# Patient Record
Sex: Female | Born: 1995
Health system: Southern US, Community
[De-identification: ages and names within clinical notes are randomized; demographics above are authoritative.]

## PROBLEM LIST (undated history)

## (undated) DIAGNOSIS — F909 Attention-deficit hyperactivity disorder, unspecified type: Secondary | ICD-10-CM

## (undated) HISTORY — DX: Attention-deficit hyperactivity disorder, unspecified type: F90.9

---
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
## 2013-10-08 ENCOUNTER — Other Ambulatory Visit: Payer: Self-pay | Admitting: Certified Nurse Midwife

## 2013-10-08 NOTE — Telephone Encounter (Signed)
Last AEX 11/25/12 No Current Aex scheduled #28/1 refill sent through to last patient

## 2013-12-16 ENCOUNTER — Encounter: Payer: Self-pay | Admitting: Certified Nurse Midwife

## 2013-12-17 ENCOUNTER — Ambulatory Visit (INDEPENDENT_AMBULATORY_CARE_PROVIDER_SITE_OTHER): Payer: BC Managed Care – PPO | Admitting: Certified Nurse Midwife

## 2013-12-17 ENCOUNTER — Encounter: Payer: Self-pay | Admitting: Certified Nurse Midwife

## 2013-12-17 VITALS — BP 122/81 | HR 75 | Resp 16 | Ht 66.5 in | Wt 150.0 lb

## 2013-12-17 DIAGNOSIS — Z309 Encounter for contraceptive management, unspecified: Secondary | ICD-10-CM

## 2013-12-17 DIAGNOSIS — Z01419 Encounter for gynecological examination (general) (routine) without abnormal findings: Secondary | ICD-10-CM

## 2013-12-17 DIAGNOSIS — Z0000 Encounter for general adult medical examination without abnormal findings: Secondary | ICD-10-CM

## 2013-12-17 LAB — POCT URINALYSIS DIPSTICK
Bilirubin, UA: NEGATIVE
Blood, UA: NEGATIVE
Glucose, UA: NEGATIVE
Ketones, UA: NEGATIVE
Leukocytes, UA: NEGATIVE
Nitrite, UA: NEGATIVE
Protein, UA: NEGATIVE
Urobilinogen, UA: NEGATIVE
pH, UA: 5

## 2013-12-17 MED ORDER — LEVONORGEST-ETH ESTRAD 91-DAY 0.15-0.03 MG PO TABS: 1.0000 | ORAL_TABLET | Freq: Every day | ORAL | Status: DC

## 2013-12-17 NOTE — Progress Notes (Signed)
18 y.o. G0P0000 Single Caucasian Fe here for annual exam. Periods normal on placebo week of Seasonique, but has brown discharge for 2 weeks prior, since onset of use. Desires extended cycle OCP continues if possible. No STD concerns no partner change, first for both.  Sees PCP prn. No other health issues today. Happy at college! Patient's last menstrual period was 12/03/2013.          Sexually active: yes  The current method of family planning is OCP (estrogen/progesterone).    Exercising: yes  walk, eliptical Smoker:  no  Health Maintenance: Pap:  none MMG:  none Colonoscopy:  none BMD:   none TDaP: 2011 Labs: Poct urine-neg Self breast exam: not done    reports that she has never smoked. She does not have any smokeless tobacco history on file. She reports that she does not drink alcohol or use illicit drugs.  Past Medical History  Diagnosis Date  . ADHD (attention deficit hyperactivity disorder)     Past Surgical History  Procedure Laterality Date  . Wisdom tooth extraction  2009    Current Outpatient Prescriptions  Medication Sig Dispense Refill  . dexmethylphenidate (FOCALIN) 5 MG tablet Take 5 mg by mouth as needed.      Marland Kitchen levocetirizine (XYZAL) 5 MG tablet Take 5 mg by mouth every evening.      . lisdexamfetamine (VYVANSE) 50 MG capsule Take 50 mg by mouth daily.      . montelukast (SINGULAIR) 10 MG tablet Take 10 mg by mouth at bedtime.      Marland Kitchen levonorgestrel-ethinyl estradiol (SEASONALE,INTROVALE,JOLESSA) 0.15-0.03 MG tablet Take 1 tablet by mouth daily.  1 Package  4   No current facility-administered medications for this visit.    Family History  Problem Relation Age of Onset  . Diabetes Mother     ROS:  Pertinent items are noted in HPI.  Otherwise, a comprehensive ROS was negative.  Exam:   BP 122/81  Pulse 75  Resp 16  Ht 5' 6.5" (1.689 m)  Wt 150 lb (68.04 kg)  BMI 23.85 kg/m2  LMP 12/03/2013 Height: 5' 6.5" (168.9 cm)  Ht Readings from Last 3  Encounters:  12/17/13 5' 6.5" (1.689 m) (81%*, Z = 0.87)   * Growth percentiles are based on CDC 2-20 Years data.    General appearance: alert, cooperative and appears stated age Head: Normocephalic, without obvious abnormality, atraumatic Neck: no adenopathy, supple, symmetrical, trachea midline and thyroid normal to inspection and palpation and non-palpable Lungs: clear to auscultation bilaterally Breasts: normal appearance, no masses or tenderness, No nipple retraction or dimpling, No nipple discharge or bleeding, No axillary or supraclavicular adenopathy Heart: regular rate and rhythm Abdomen: soft, non-tender; no masses,  no organomegaly Extremities: extremities normal, atraumatic, no cyanosis or edema Skin: Skin color, texture, turgor normal. No rashes or lesions Lymph nodes: Cervical, supraclavicular, and axillary nodes normal. No abnormal inguinal nodes palpated Neurologic: Grossly normal   Pelvic: External genitalia:  no lesions              Urethra:  normal appearing urethra with no masses, tenderness or lesions              Bartholin's and Skene's: normal                 Vagina: normal appearing vagina with normal color and discharge, no lesions              Cervix: normal, non tender  Pap taken: no Bimanual Exam:  Uterus:  normal size, contour, position, consistency, mobility, non-tender and anteverted              Adnexa: normal adnexa and no mass, fullness, tenderness               Rectovaginal: Confirms               Anus:  deferred  A:  Well Woman with normal exam  Contraception OCP desired  P:   Reviewed health and wellness pertinent to exam   Rx Seasonale with instructions, [facility], may stop the brown spotting. Patient to keep menses calendar and advise.  Pap smear as per guidelines   pap smear not taken today age 18  counseled on breast self exam, STD prevention, HIV risk factors and prevention, use and side  effects of OCP's, adequate intake of calcium and vitamin D  return annually or prn  An After Visit Summary was printed and given to the patient.

## 2013-12-17 NOTE — Patient Instructions (Signed)
General topics  Next pap or exam is  due in 1 year Take a Women's multivitamin Take 1200 mg. of calcium daily - prefer dietary If any concerns in interim to call back  Breast Self-Awareness Practicing breast self-awareness may pick up problems early, prevent significant medical complications, and possibly save your life. By practicing breast self-awareness, you can become familiar with how your breasts look and feel and if your breasts are changing. This allows you to notice changes early. It can also offer you some reassurance that your breast health is good. One way to learn what is normal for your breasts and whether your breasts are changing is to do a breast self-exam. If you find a lump or something that was not present in the past, it is best to contact your caregiver right away. Other findings that should be evaluated by your caregiver include nipple discharge, especially if it is bloody; skin changes or reddening; areas where the skin seems to be pulled in (retracted); or new lumps and bumps. Breast pain is seldom associated with cancer (malignancy), but should also be evaluated by a caregiver. BREAST SELF-EXAM The best time to examine your breasts is 5 7 days after your menstrual period is over.  ExitCare Patient Information 2013 [employer].   Exercise to Stay Healthy Exercise helps you become and stay healthy. EXERCISE IDEAS AND TIPS Choose exercises that:  You enjoy.  Fit into your day. You do not need to exercise really hard to be healthy. You can do exercises at a slow or medium level and stay healthy. You can:  Stretch before and after working out.  Try yoga, Pilates, or tai chi.  Lift weights.  Walk fast, swim, jog, run, climb stairs, bicycle, dance, or rollerskate.  Take aerobic classes. Exercises that burn about 150 calories:  Running 1  miles in 15 minutes.  Playing volleyball for 45 to 60 minutes.  Washing and waxing a car for 45 to 60  minutes.  Playing touch football for 45 minutes.  Walking 1  miles in 35 minutes.  Pushing a stroller 1  miles in 30 minutes.  Playing basketball for 30 minutes.  Raking leaves for 30 minutes.  Bicycling 5 miles in 30 minutes.  Walking 2 miles in 30 minutes.  Dancing for 30 minutes.  Shoveling snow for 15 minutes.  Swimming laps for 20 minutes.  Walking up stairs for 15 minutes.  Bicycling 4 miles in 15 minutes.  Gardening for 30 to 45 minutes.  Jumping rope for 15 minutes.  Washing windows or floors for 45 to 60 minutes. Document Released: 10/26/2010 Document Revised: 12/16/2011 Document Reviewed: 10/26/2010 ExitCare Patient Information 2013 [employer].   Other topics ( that may be useful information):    Sexually Transmitted Disease Sexually transmitted disease (STD) refers to any infection that is passed from person to person during sexual activity. This may happen by way of saliva, semen, blood, vaginal mucus, or urine. Common STDs include:  Gonorrhea.  Chlamydia.  Syphilis.  HIV/AIDS.  Genital herpes.  Hepatitis B and C.  Trichomonas.  Human papillomavirus (HPV).  Pubic lice. CAUSES  An STD may be spread by bacteria, virus, or parasite. A person can get an STD by:  Sexual intercourse with an infected person.  Sharing sex toys with an infected person.  Sharing needles with an infected person.  Having intimate contact with the genitals, mouth, or rectal areas of an infected person. SYMPTOMS  Some people may not have any symptoms, but   they can still pass the infection to others. Different STDs have different symptoms. Symptoms include:  Painful or bloody urination.  Pain in the pelvis, abdomen, vagina, anus, throat, or eyes.  Skin rash, itching, irritation, growths, or sores (lesions). These usually occur in the genital or anal area.  Abnormal vaginal discharge.  Penile discharge in men.  Soft, flesh-colored skin growths in the  genital or anal area.  Fever.  Pain or bleeding during sexual intercourse.  Swollen glands in the groin area.  Yellow skin and eyes (jaundice). This is seen with hepatitis. DIAGNOSIS  To make a diagnosis, your caregiver may:  Take a medical history.  Perform a physical exam.  Take a specimen (culture) to be examined.  Examine a sample of discharge under a microscope.  Perform blood test TREATMENT   Chlamydia, gonorrhea, trichomonas, and syphilis can be cured with antibiotic medicine.  Genital herpes, hepatitis, and HIV can be treated, but not cured, with prescribed medicines. The medicines will lessen the symptoms.  Genital warts from HPV can be treated with medicine or by freezing, burning (electrocautery), or surgery. Warts may come back.  HPV is a virus and cannot be cured with medicine or surgery.However, abnormal areas may be followed very closely by your caregiver and may be removed from the cervix, vagina, or vulva through office procedures or surgery. If your diagnosis is confirmed, your recent sexual partners need treatment. This is true even if they are symptom-free or have a negative culture or evaluation. They should not have sex until their caregiver says it is okay. HOME CARE INSTRUCTIONS  All sexual partners should be informed, tested, and treated for all STDs.  Take your antibiotics as directed. Finish them even if you start to feel better.  Only take over-the-counter or prescription medicines for pain, discomfort, or fever as directed by your caregiver.  Rest.  Eat a balanced diet and drink enough fluids to keep your urine clear or pale yellow.  Do not have sex until treatment is completed and you have followed up with your caregiver. STDs should be checked after treatment.  Keep all follow-up appointments, Pap tests, and blood tests as directed by your caregiver.  Only use latex condoms and water-soluble lubricants during sexual activity. Do not use  petroleum jelly or oils.  Avoid alcohol and illegal drugs.  Get vaccinated for HPV and hepatitis. If you have not received these vaccines in the past, talk to your caregiver about whether one or both might be right for you.  Avoid risky sex practices that can break the skin. The only way to avoid getting an STD is to avoid all sexual activity.Latex condoms and dental dams (for oral sex) will help lessen the risk of getting an STD, but will not completely eliminate the risk. SEEK MEDICAL CARE IF:   You have a fever.  You have any new or worsening symptoms. Document Released: 12/14/2002 Document Revised: 12/16/2011 Document Reviewed: 12/21/2010 [facility] Patient Information 2013 [place].    Domestic Abuse You are being battered or abused if someone close to you hits, pushes, or physically hurts you in any way. You also are being abused if you are forced into activities. You are being sexually abused if you are forced to have sexual contact of any kind. You are being emotionally abused if you are made to feel worthless or if you are constantly threatened. It is important to remember that help is available. No one has the right to abuse you. PREVENTION OF FURTHER  ABUSE  Learn the warning signs of danger. This varies with situations but may include: the use of alcohol, threats, isolation from friends and family, or forced sexual contact. Leave if you feel that violence is going to occur.  If you are attacked or beaten, report it to the police so the abuse is documented. You do not have to press charges. The police can protect you while you or the attackers are leaving. Get the officer's name and badge number and a copy of the report.  Find someone you can trust and tell them what is happening to you: your caregiver, a nurse, clergy member, close friend or family member. Feeling ashamed is natural, but remember that you have done nothing wrong. No one deserves abuse. Document Released:  09/20/2000 Document Revised: 12/16/2011 Document Reviewed: 11/29/2010 ExitCare Patient Information 2013 ExitCare[employer].    How Much is Too Much Alcohol? Drinking too much alcohol can cause injury, accidents, and health problems. These types of problems can include:   Car crashes.  Falls.  Family fighting (domestic violence).  Drowning.  Fights.  Injuries.  Burns.  Damage to certain organs.  Having a baby with birth defects. ONE DRINK CAN BE TOO MUCH WHEN YOU ARE:  Working.  Pregnant or breastfeeding.  Taking medicines. Ask your doctor.  Driving or planning to drive. If you or someone you know has a drinking problem, get help from a doctor.  Document Released: 07/20/2009 Document Revised: 12/16/2011 Document Reviewed: 07/20/2009 ExitCare Patient Information 2013 ExitCare[employer].   Smoking Hazards Smoking cigarettes is extremely bad for your health. Tobacco smoke has over 200 known poisons in it. There are over 60 chemicals in tobacco smoke that cause cancer. Some of the chemicals found in cigarette smoke include:   Cyanide.  Benzene.  Formaldehyde.  Methanol (wood alcohol).  Acetylene (fuel used in welding torches).  Ammonia. Cigarette smoke also contains the poisonous gases nitrogen oxide and carbon monoxide.  Cigarette smokers have an increased risk of many serious medical problems and Smoking causes approximately:  90% of all lung cancer deaths in men.  80% of all lung cancer deaths in women.  90% of deaths from chronic obstructive lung disease. Compared with nonsmokers, smoking increases the risk of:  Coronary heart disease by 2 to 4 times.  Stroke by 2 to 4 times.  Men developing lung cancer by 23 times.  Women developing lung cancer by 13 times.  Dying from chronic obstructive lung diseases by 12 times.  . Smoking is the most preventable cause of death and disease in our society.  WHY IS SMOKING ADDICTIVE?  Nicotine is the chemical  agent in tobacco that is capable of causing addiction or dependence.  When you smoke and inhale, nicotine is absorbed rapidly into the bloodstream through your lungs. Nicotine absorbed through the lungs is capable of creating a powerful addiction. Both inhaled and non-inhaled nicotine may be addictive.  Addiction studies of cigarettes and spit tobacco show that addiction to nicotine occurs mainly during the teen years, when young people begin using tobacco products. WHAT ARE THE BENEFITS OF QUITTING?  There are many health benefits to quitting smoking.   Likelihood of developing cancer and heart disease decreases. Health improvements are seen almost immediately.  Blood pressure, pulse rate, and breathing patterns start returning to normal soon after quitting. QUITTING SMOKING   American Lung Association - 1-800-LUNGUSA  American Cancer Society - 1-800-ACS-2345 Document Released: 10/31/2004 Document Revised: 12/16/2011 Document Reviewed: 07/05/2009 ExitCare Patient Information 2013 ExitCare,   LLC.   Stress Management Stress is a state of physical or mental tension that often results from changes in your life or normal routine. Some common causes of stress are:  Death of a loved one.  Injuries or severe illnesses.  Getting fired or changing jobs.  Moving into a new home. Other causes may be:  Sexual problems.  Business or financial losses.  Taking on a large debt.  Regular conflict with someone at home or at work.  Constant tiredness from lack of sleep. It is not just bad things that are stressful. It may be stressful to:  Win the lottery.  Get married.  Buy a new car. The amount of stress that can be easily tolerated varies from person to person. Changes generally cause stress, regardless of the types of change. Too much stress can affect your health. It may lead to physical or emotional problems. Too little stress (boredom) may also become stressful. SUGGESTIONS TO  REDUCE STRESS:  Talk things over with your family and friends. It often is helpful to share your concerns and worries. If you feel your problem is serious, you may want to get help from a professional counselor.  Consider your problems one at a time instead of lumping them all together. Trying to take care of everything at once may seem impossible. List all the things you need to do and then start with the most important one. Set a goal to accomplish 2 or 3 things each day. If you expect to do too many in a single day you will naturally fail, causing you to feel even more stressed.  Do not use alcohol or drugs to relieve stress. Although you may feel better for a short time, they do not remove the problems that caused the stress. They can also be habit forming.  Exercise regularly - at least 3 times per week. Physical exercise can help to relieve that "uptight" feeling and will relax you.  The shortest distance between despair and hope is often a good night's sleep.  Go to bed and get up on time allowing yourself time for appointments without being rushed.  Take a short "time-out" period from any stressful situation that occurs during the day. Close your eyes and take some deep breaths. Starting with the muscles in your face, tense them, hold it for a few seconds, then relax. Repeat this with the muscles in your neck, shoulders, hand, stomach, back and legs.  Take good care of yourself. Eat a balanced diet and get plenty of rest.  Schedule time for having fun. Take a break from your daily routine to relax. HOME CARE INSTRUCTIONS   Call if you feel overwhelmed by your problems and feel you can no longer manage them on your own.  Return immediately if you feel like hurting yourself or someone else. Document Released: 03/19/2001 Document Revised: 12/16/2011 Document Reviewed: 11/09/2007 [facility] Patient Information 2013 [place].  Great to see you today. Let me know if brown discharge  continues. Have a great spring! Debbi

## 2013-12-24 NOTE — Progress Notes (Signed)
Reviewed personally.  M. Suzanne Sharan Mcenaney, MD.  

## 2014-07-11 ENCOUNTER — Telehealth: Payer: Self-pay | Admitting: Certified Nurse Midwife

## 2014-07-11 NOTE — Telephone Encounter (Signed)
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 Patient will be in town late this week. Please advise?

## 2014-07-11 NOTE — Telephone Encounter (Signed)
Spoke with patient's mother. Appointment scheduled for this Thursday at 10:45am with Dr.Miller. Agreeable to date and time.  Routing to Dr.Miller Cc: Verner Choleborah S. Leonard CNM   Routing to provider for final review. Patient agreeable to disposition. Will close encounter ;

## 2014-07-11 NOTE — Telephone Encounter (Signed)
Spoke with patient's mother. Okay per ROI. Who states that patient was seen at student health center for break through bleeding and was told that she has a polyp on her cervix that needs removal. Mother states that patient would feel more comfortable having it done at our office. Patient will be in town for fall break this Thursday and Friday and requests an appointment. Available early morning Thursday and then after 2pm or any time on Friday. Advised would have to check scheduling and give her a call back. Mother is agreeable.

## 2014-07-14 ENCOUNTER — Encounter: Payer: Self-pay | Admitting: Obstetrics & Gynecology

## 2014-07-14 ENCOUNTER — Ambulatory Visit (INDEPENDENT_AMBULATORY_CARE_PROVIDER_SITE_OTHER): Payer: BC Managed Care – PPO | Admitting: Obstetrics & Gynecology

## 2014-07-14 VITALS — BP 110/70 | HR 68 | Resp 16 | Ht 66.5 in | Wt 157.0 lb

## 2014-07-14 DIAGNOSIS — N930 Postcoital and contact bleeding: Secondary | ICD-10-CM

## 2014-07-14 DIAGNOSIS — Z124 Encounter for screening for malignant neoplasm of cervix: Secondary | ICD-10-CM

## 2014-07-14 NOTE — Progress Notes (Signed)
Subjective:     Patient ID: Brandi Bradshaw, female   DOB: [birth year], 18 y.o.   MRN: [id number]  HPI 18 yo G0 SWF here for polyp removal.  Was seen in student health due to two episodes of bleeding with intercourse.  Was advised she had a large cervical polyp that needed removal.  Bleeding has only been with intercourse, is painless and bright red.  It stops quickly after intercourse.  [facility].  No trouble with this.  Only cycles every 3 months.  LMP was in August.  Pt not quite sure of the date.  No urinary or bowel symptoms.    Review of Systems  All other systems reviewed and are negative.      Objective:   Physical Exam  Constitutional: She is oriented to person, place, and time. She appears well-developed and well-nourished.  Genitourinary: Vagina normal and uterus normal. There is no rash, tenderness or lesion on the right labia. There is no rash, tenderness or lesion on the left labia.    Lymphadenopathy:       Right: No inguinal adenopathy present.       Left: No inguinal adenopathy present.  Neurological: She is alert and oriented to person, place, and time.  Skin: Skin is warm and dry.  Psychiatric: She has a normal mood and affect.       Assessment:     Bleeding with intercourse x 2 Normal cervix with area of glandular tissue posterior due to ectocervical tradition zone     Plan:     Pap obtained.  D/W pt and mother findings.  Do not feel anything needs to be done at this time.  Pictures drawn for both pt and her mother for better understanding of findings.  As long as pap is normal, routine follow up recommended.  All questions answered.

## 2014-07-18 LAB — IPS PAP SMEAR ONLY

## 2014-08-25 ENCOUNTER — Telehealth: Payer: Self-pay | Admitting: Certified Nurse Midwife

## 2014-08-25 NOTE — Telephone Encounter (Signed)
Spoke with patient. Patient states that she is currently taking Seasonale for birth control. Has been taking this birth control since March on last year. "Towards the end of the second month of my birth control pack I have been having some light bleeding and then I have bleeding throughout the entire month of my third pack before my cycle." Patient is considering switching OCP. Advised patient that with only having a cycle every 3 months it is normal to experience some break through bleeding throughout the month. Patient states that when she starts to have bleeding during her 2nd month in the last week she stops taking her pills, lets her body have a cycle and then restarts new pack of OCP. Patient always uses [facility]. 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 Would like to come in for OV in January for check up and refill of OCP until she can be seen in June for annual. Appointment scheduled for 1/6 at 10:30am with Verner Choleborah S. Leonard CNM. Patient is agreeable to date and time. Patient will wait until appointment to switch OCP unless she has any problems before then. Will call if she changes her mind or would like to be seen sooner.  Verner Choleborah S. Leonard CNM any recommendations for this patient regarding OCP?

## 2014-08-25 NOTE — Telephone Encounter (Signed)
She needs to take her pack as directed to see if this will resolve

## 2014-08-25 NOTE — Telephone Encounter (Addendum)
Pt wants to switch birth control she going abroad Feb 1st

## 2014-08-26 NOTE — Telephone Encounter (Signed)
Spoke with patient. Advised patient of message as seen below from Deborah S. Leonard CNM. Patient is agreeable and verbalizes understanding.   Routing to provider for final review. Patient agreeable to disposition. Will close encounter   

## 2014-10-12 ENCOUNTER — Ambulatory Visit: Payer: BC Managed Care – PPO | Admitting: Certified Nurse Midwife

## 2014-10-17 ENCOUNTER — Ambulatory Visit: Payer: Self-pay | Admitting: Certified Nurse Midwife

## 2014-10-21 ENCOUNTER — Ambulatory Visit (INDEPENDENT_AMBULATORY_CARE_PROVIDER_SITE_OTHER): Payer: BLUE CROSS/BLUE SHIELD | Admitting: Certified Nurse Midwife

## 2014-10-21 ENCOUNTER — Encounter: Payer: Self-pay | Admitting: Certified Nurse Midwife

## 2014-10-21 VITALS — BP 110/74 | HR 78 | Ht 66.5 in | Wt 157.2 lb

## 2014-10-21 DIAGNOSIS — Z3009 Encounter for other general counseling and advice on contraception: Secondary | ICD-10-CM

## 2014-10-21 MED ORDER — LEVONORGEST-ETH ESTRAD 91-DAY 0.15-0.03 MG PO TABS: 1.0000 | ORAL_TABLET | Freq: Every day | ORAL | Status: DC

## 2014-10-21 NOTE — Progress Notes (Signed)
Reviewed personally.  M. Suzanne Roe Koffman, MD.  

## 2014-10-21 NOTE — Patient Instructions (Signed)
Oral Contraception Use Oral contraceptive pills (OCPs) are medicines taken to prevent pregnancy. OCPs work by preventing the ovaries from releasing eggs. The hormones in OCPs also cause the cervical mucus to thicken, preventing the sperm from entering the uterus. The hormones also cause the uterine lining to become thin, not allowing a fertilized egg to attach to the inside of the uterus. OCPs are highly effective when taken exactly as prescribed. However, OCPs do not prevent sexually transmitted diseases (STDs). Safe sex practices, such as using condoms along with an OCP, can help prevent STDs. Before taking OCPs, you may have a physical exam and Pap test. Your health care provider may also order blood tests if necessary. Your health care provider will make sure you are a good candidate for oral contraception. Discuss with your health care provider the possible side effects of the OCP you may be prescribed. When starting an OCP, it can take 2 to 3 months for the body to adjust to the changes in hormone levels in your body.  HOW TO TAKE ORAL CONTRACEPTIVE PILLS Your health care provider may advise you on how to start taking the first cycle of OCPs. Otherwise, you can:   Start on day 1 of your menstrual period. You will not need any backup contraceptive protection with this start time.   Start on the first Sunday after your menstrual period or the day you get your prescription. In these cases, you will need to use backup contraceptive protection for the first week.   Start the pill at any time of your cycle. If you take the pill within 5 days of the start of your period, you are protected against pregnancy right away. In this case, you will not need a backup form of birth control. If you start at any other time of your menstrual cycle, you will need to use another form of birth control for 7 days. If your OCP is the type called a minipill, it will protect you from pregnancy after taking it for 2 days (48  hours). After you have started taking OCPs:   If you forget to take 1 pill, take it as soon as you remember. Take the next pill at the regular time.   If you miss 2 or more pills, call your health care provider because different pills have different instructions for missed doses. Use backup birth control until your next menstrual period starts.   If you use a 28-day pack that contains inactive pills and you miss 1 of the last 7 pills (pills with no hormones), it will not matter. Throw away the rest of the non-hormone pills and start a new pill pack.  No matter which day you start the OCP, you will always start a new pack on that same day of the week. Have an extra pack of OCPs and a backup contraceptive method available in case you miss some pills or lose your OCP pack.  HOME CARE INSTRUCTIONS   Do not smoke.   Always use a condom to protect against STDs. OCPs do not protect against STDs.   Use a calendar to mark your menstrual period days.   Read the information and directions that came with your OCP. Talk to your health care provider if you have questions.  SEEK MEDICAL CARE IF:   You develop nausea and vomiting.   You have abnormal vaginal discharge or bleeding.   You develop a rash.   You miss your menstrual period.   You are losing   your hair.   You need treatment for mood swings or depression.   You get dizzy when taking the OCP.   You develop acne from taking the OCP.   You become pregnant.  SEEK IMMEDIATE MEDICAL CARE IF:   You develop chest pain.   You develop shortness of breath.   You have an uncontrolled or severe headache.   You develop numbness or slurred speech.   You develop visual problems.   You develop pain, redness, and swelling in the legs.  Document Released: 09/12/2011 Document Revised: 02/07/2014 Document Reviewed: 03/14/2013 Exit[employer] Patient Information 2015 [employer]. This information is not intended to replace  advice given to you by your health care provider. Make sure you discuss any questions you have with your health care provider.  

## 2014-10-21 NOTE — Progress Notes (Signed)
18 y.o. Single Caucasian G0P0000 female here for clarification of how she should be taking her Seasonale uses for contraception. Patient was busy with college and did not take complete sleeve of second month, stopped after three weeks to have menses.  She had forgotten that she was to complete entire pack. [place] be leaving for [place] in 2/16 for 6 month study and needs to make sure she is using correctly and needs vacation over ride Rx to carry with her. Periods have been normal with no BTB until she stopped to have period. Patient on period now.  No health issues. Also needs a note which states why she is taking.  O: Healthy female, WD WN Affect: normal orientation X 3    A:Contraception with Seasonale use, with incorrect use, but no concerns of pregnancy after review. Needs Rx for 6 months due to study abroad  P: Reviewed with patient with visual display how she should be using her pack. Patient voiced understanding and described to provider correct usage. Rx Seasonale see order with vacation over ride Note given to carry with her. Will schedule aex when she returns, due in late 3/16. Wished well on her trip.   20  minutes spent with patient in face to face counseling regarding correct usage of oral contraception..  RV prn

## 2014-10-26 ENCOUNTER — Telehealth: Payer: Self-pay | Admitting: Certified Nurse Midwife

## 2014-10-26 NOTE — Telephone Encounter (Signed)
Made in error will close encounter °

## 2015-03-16 ENCOUNTER — Encounter: Payer: Self-pay | Admitting: Certified Nurse Midwife

## 2015-03-16 ENCOUNTER — Ambulatory Visit (INDEPENDENT_AMBULATORY_CARE_PROVIDER_SITE_OTHER): Payer: BLUE CROSS/BLUE SHIELD | Admitting: Certified Nurse Midwife

## 2015-03-16 VITALS — BP 118/70 | HR 74 | Resp 20 | Ht 67.0 in | Wt 158.0 lb

## 2015-03-16 DIAGNOSIS — Z01419 Encounter for gynecological examination (general) (routine) without abnormal findings: Secondary | ICD-10-CM | POA: Diagnosis not present

## 2015-03-16 DIAGNOSIS — Z3009 Encounter for other general counseling and advice on contraception: Secondary | ICD-10-CM | POA: Diagnosis not present

## 2015-03-16 LAB — POCT URINALYSIS DIPSTICK
Bilirubin, UA: NEGATIVE
Blood, UA: NEGATIVE
Glucose, UA: NEGATIVE
Ketones, UA: NEGATIVE
LEUKOCYTES UA: NEGATIVE
Nitrite, UA: NEGATIVE
PROTEIN UA: NEGATIVE
SPEC GRAV UA: 1.015
UROBILINOGEN UA: NEGATIVE
pH, UA: 6.5

## 2015-03-16 MED ORDER — LEVONORGEST-ETH ESTRAD 91-DAY 0.15-0.03 MG PO TABS: 1.0000 | ORAL_TABLET | Freq: Every day | ORAL | Status: DC

## 2015-03-16 NOTE — Progress Notes (Signed)
19 y.o. G0P0000 Single  Caucasian Fe here for annual exam. Periods normal, occasional BTB, but none now. Partner same, no STD screening needed. No bleeding with sexual activity. No other health issues today. See dermatology for facial medication and sees allergist for asthma management,PCP for labs and Vyvanse.  Study abroad in [place] was great for the past 6 months!  Patient's last menstrual period was 01/12/2015.          Sexually active: Yes.    The current method of family planning is OCP (estrogen/progesterone) & condoms all the time    Exercising: Yes.    walking & barre Smoker:  no  Health Maintenance: Pap:  07-14-14 neg MMG:  none Colonoscopy:  none BMD:   none TDaP:  2011 Labs: none Self breast exam: not done   reports that she has never smoked. She has never used smokeless tobacco. She reports that she does not drink alcohol or use illicit drugs.  Past Medical History  Diagnosis Date  . ADHD (attention deficit hyperactivity disorder)     Past Surgical History  Procedure Laterality Date  . Wisdom tooth extraction  2009    Current Outpatient Prescriptions  Medication Sig Dispense Refill  . clindamycin (CLEOCIN T) 1 % external solution as needed.  2  . dexmethylphenidate (FOCALIN) 5 MG tablet Take 5 mg by mouth as needed.    . DRYSOL 20 % external solution as needed.  2  . levocetirizine (XYZAL) 5 MG tablet Take 5 mg by mouth every evening.    Marland Kitchen levonorgestrel-ethinyl estradiol (SEASONALE,INTROVALE,JOLESSA) 0.15-0.03 MG tablet Take 1 tablet by mouth daily. 3 Package 4  . lisdexamfetamine (VYVANSE) 50 MG capsule Take 50 mg by mouth daily.    . montelukast (SINGULAIR) 10 MG tablet Take 10 mg by mouth at bedtime.     No current facility-administered medications for this visit.    Family History  Problem Relation Age of Onset  . Diabetes Mother     ROS:  Pertinent items are noted in HPI.  Otherwise, a comprehensive ROS was negative.  Exam:   BP 118/70 mmHg  Pulse  74  Resp 20  Ht  (1.702 m)  Wt 158 lb (71.668 kg)  BMI 24.74 kg/m2  LMP 01/12/2015 Height:  (170.2 cm) Ht Readings from Last 3 Encounters:  03/16/15  (1.702 m)  10/21/14 5' 6.5" (1.689 m)  07/14/14 5' 6.5" (1.689 m) (81 %*, Z = 0.86)   * Growth percentiles are based on CDC 2-20 Years data.    General appearance: alert, cooperative and appears stated age Head: Normocephalic, without obvious abnormality, atraumatic Neck: no adenopathy, supple, symmetrical, trachea midline and thyroid normal to inspection and palpation Lungs: clear to auscultation bilaterally Breasts: normal appearance, no masses or tenderness, No nipple retraction or dimpling, No nipple discharge or bleeding, No axillary or supraclavicular adenopathy Heart: regular rate and rhythm Abdomen: soft, non-tender; no masses,  no organomegaly Extremities: extremities normal, atraumatic, no cyanosis or edema Skin: Skin color, texture, turgor normal. No rashes or lesions Lymph nodes: Cervical, supraclavicular, and axillary nodes normal. No abnormal inguinal nodes palpated Neurologic: Grossly normal   Pelvic: External genitalia:  no lesions              Urethra:  normal appearing urethra with no masses, tenderness or lesions              Bartholin's and Skene's: normal  Vagina: normal appearing vagina with normal color and discharge, no lesions              Cervix: normal appearance, ectropian os, no lesions              Pap taken: No. Bimanual Exam:  Uterus:  normal size, contour, position, consistency, mobility, non-tender              Adnexa: normal adnexa and no mass, fullness, tenderness               Rectovaginal: Confirms               Anus:  Normal appearance  Chaperone present: Yes  A:  Well Woman with normal exam  Contraception OCP desired  Allergy medication with allergist  P:   Reviewed health and wellness pertinent to exam  Rx Seasonale see order  Follow with MD as  indicated  Pap smear not taken   counseled on breast self exam, STD prevention, HIV risk factors and prevention, use and side effects of OCP's,  adequate intake of calcium and vitamin D, diet and exercise  return annually or prn  An After Visit Summary was printed and given to the patient.

## 2015-03-16 NOTE — Patient Instructions (Signed)
General topics  Next pap or exam is  due in 1 year Take a Women's multivitamin Take 1200 mg. of calcium daily - prefer dietary If any concerns in interim to call back  Breast Self-Awareness Practicing breast self-awareness may pick up problems early, prevent significant medical complications, and possibly save your life. By practicing breast self-awareness, you can become familiar with how your breasts look and feel and if your breasts are changing. This allows you to notice changes early. It can also offer you some reassurance that your breast health is good. One way to learn what is normal for your breasts and whether your breasts are changing is to do a breast self-exam. If you find a lump or something that was not present in the past, it is best to contact your caregiver right away. Other findings that should be evaluated by your caregiver include nipple discharge, especially if it is bloody; skin changes or reddening; areas where the skin seems to be pulled in (retracted); or new lumps and bumps. Breast pain is seldom associated with cancer (malignancy), but should also be evaluated by a caregiver. BREAST SELF-EXAM The best time to examine your breasts is 5 7 days after your menstrual period is over.  ExitCare Patient Information 2013 [employer].   Exercise to Stay Healthy Exercise helps you become and stay healthy. EXERCISE IDEAS AND TIPS Choose exercises that:  You enjoy.  Fit into your day. You do not need to exercise really hard to be healthy. You can do exercises at a slow or medium level and stay healthy. You can:  Stretch before and after working out.  Try yoga, Pilates, or tai chi.  Lift weights.  Walk fast, swim, jog, run, climb stairs, bicycle, dance, or rollerskate.  Take aerobic classes. Exercises that burn about 150 calories:  Running 1  miles in 15 minutes.  Playing volleyball for 45 to 60 minutes.  Washing and waxing a car for 45 to 60  minutes.  Playing touch football for 45 minutes.  Walking 1  miles in 35 minutes.  Pushing a stroller 1  miles in 30 minutes.  Playing basketball for 30 minutes.  Raking leaves for 30 minutes.  Bicycling 5 miles in 30 minutes.  Walking 2 miles in 30 minutes.  Dancing for 30 minutes.  Shoveling snow for 15 minutes.  Swimming laps for 20 minutes.  Walking up stairs for 15 minutes.  Bicycling 4 miles in 15 minutes.  Gardening for 30 to 45 minutes.  Jumping rope for 15 minutes.  Washing windows or floors for 45 to 60 minutes. Document Released: 10/26/2010 Document Revised: 12/16/2011 Document Reviewed: 10/26/2010 ExitCare Patient Information 2013 [employer].   Other topics ( that may be useful information):    Sexually Transmitted Disease Sexually transmitted disease (STD) refers to any infection that is passed from person to person during sexual activity. This may happen by way of saliva, semen, blood, vaginal mucus, or urine. Common STDs include:  Gonorrhea.  Chlamydia.  Syphilis.  HIV/AIDS.  Genital herpes.  Hepatitis B and C.  Trichomonas.  Human papillomavirus (HPV).  Pubic lice. CAUSES  An STD may be spread by bacteria, virus, or parasite. A person can get an STD by:  Sexual intercourse with an infected person.  Sharing sex toys with an infected person.  Sharing needles with an infected person.  Having intimate contact with the genitals, mouth, or rectal areas of an infected person. SYMPTOMS  Some people may not have any symptoms, but   they can still pass the infection to others. Different STDs have different symptoms. Symptoms include:  Painful or bloody urination.  Pain in the pelvis, abdomen, vagina, anus, throat, or eyes.  Skin rash, itching, irritation, growths, or sores (lesions). These usually occur in the genital or anal area.  Abnormal vaginal discharge.  Penile discharge in men.  Soft, flesh-colored skin growths in the  genital or anal area.  Fever.  Pain or bleeding during sexual intercourse.  Swollen glands in the groin area.  Yellow skin and eyes (jaundice). This is seen with hepatitis. DIAGNOSIS  To make a diagnosis, your caregiver may:  Take a medical history.  Perform a physical exam.  Take a specimen (culture) to be examined.  Examine a sample of discharge under a microscope.  Perform blood test TREATMENT   Chlamydia, gonorrhea, trichomonas, and syphilis can be cured with antibiotic medicine.  Genital herpes, hepatitis, and HIV can be treated, but not cured, with prescribed medicines. The medicines will lessen the symptoms.  Genital warts from HPV can be treated with medicine or by freezing, burning (electrocautery), or surgery. Warts may come back.  HPV is a virus and cannot be cured with medicine or surgery.However, abnormal areas may be followed very closely by your caregiver and may be removed from the cervix, vagina, or vulva through office procedures or surgery. If your diagnosis is confirmed, your recent sexual partners need treatment. This is true even if they are symptom-free or have a negative culture or evaluation. They should not have sex until their caregiver says it is okay. HOME CARE INSTRUCTIONS  All sexual partners should be informed, tested, and treated for all STDs.  Take your antibiotics as directed. Finish them even if you start to feel better.  Only take over-the-counter or prescription medicines for pain, discomfort, or fever as directed by your caregiver.  Rest.  Eat a balanced diet and drink enough fluids to keep your urine clear or pale yellow.  Do not have sex until treatment is completed and you have followed up with your caregiver. STDs should be checked after treatment.  Keep all follow-up appointments, Pap tests, and blood tests as directed by your caregiver.  Only use latex condoms and water-soluble lubricants during sexual activity. Do not use  petroleum jelly or oils.  Avoid alcohol and illegal drugs.  Get vaccinated for HPV and hepatitis. If you have not received these vaccines in the past, talk to your caregiver about whether one or both might be right for you.  Avoid risky sex practices that can break the skin. The only way to avoid getting an STD is to avoid all sexual activity.Latex condoms and dental dams (for oral sex) will help lessen the risk of getting an STD, but will not completely eliminate the risk. SEEK MEDICAL CARE IF:   You have a fever.  You have any new or worsening symptoms. Document Released: 12/14/2002 Document Revised: 12/16/2011 Document Reviewed: 12/21/2010 [facility] Patient Information 2013 [employer].    Domestic Abuse You are being battered or abused if someone close to you hits, pushes, or physically hurts you in any way. You also are being abused if you are forced into activities. You are being sexually abused if you are forced to have sexual contact of any kind. You are being emotionally abused if you are made to feel worthless or if you are constantly threatened. It is important to remember that help is available. No one has the right to abuse you. PREVENTION OF FURTHER  ABUSE  Learn the warning signs of danger. This varies with situations but may include: the use of alcohol, threats, isolation from friends and family, or forced sexual contact. Leave if you feel that violence is going to occur.  If you are attacked or beaten, report it to the police so the abuse is documented. You do not have to press charges. The police can protect you while you or the attackers are leaving. Get the officer's name and badge number and a copy of the report.  Find someone you can trust and tell them what is happening to you: your caregiver, a nurse, clergy member, close friend or family member. Feeling ashamed is natural, but remember that you have done nothing wrong. No one deserves abuse. Document Released:  09/20/2000 Document Revised: 12/16/2011 Document Reviewed: 11/29/2010 [employer] Patient Information 2013 [employer].    How Much is Too Much Alcohol? Drinking too much alcohol can cause injury, accidents, and health problems. These types of problems can include:   Car crashes.  Falls.  Family fighting (domestic violence).  Drowning.  Fights.  Injuries.  Burns.  Damage to certain organs.  Having a baby with birth defects. ONE DRINK CAN BE TOO MUCH WHEN YOU ARE:  Working.  Pregnant or breastfeeding.  Taking medicines. Ask your doctor.  Driving or planning to drive. If you or someone you know has a drinking problem, get help from a doctor.  Document Released: 07/20/2009 Document Revised: 12/16/2011 Document Reviewed: 07/20/2009 ExitCare Patient Information 2013 [employer].   Smoking Hazards Smoking cigarettes is extremely bad for your health. Tobacco smoke has over 200 known poisons in it. There are over 60 chemicals in tobacco smoke that cause cancer. Some of the chemicals found in cigarette smoke include:   Cyanide.  Benzene.  Formaldehyde.  Methanol (wood alcohol).  Acetylene (fuel used in welding torches).  Ammonia. Cigarette smoke also contains the poisonous gases nitrogen oxide and carbon monoxide.  Cigarette smokers have an increased risk of many serious medical problems and Smoking causes approximately:  90% of all lung cancer deaths in men.  80% of all lung cancer deaths in women.  90% of deaths from chronic obstructive lung disease. Compared with nonsmokers, smoking increases the risk of:  Coronary heart disease by 2 to 4 times.  Stroke by 2 to 4 times.  Men developing lung cancer by 23 times.  Women developing lung cancer by 13 times.  Dying from chronic obstructive lung diseases by 12 times.  . Smoking is the most preventable cause of death and disease in our society.  WHY IS SMOKING ADDICTIVE?  Nicotine is the chemical  agent in tobacco that is capable of causing addiction or dependence.  When you smoke and inhale, nicotine is absorbed rapidly into the bloodstream through your lungs. Nicotine absorbed through the lungs is capable of creating a powerful addiction. Both inhaled and non-inhaled nicotine may be addictive.  Addiction studies of cigarettes and spit tobacco show that addiction to nicotine occurs mainly during the teen years, when young people begin using tobacco products. WHAT ARE THE BENEFITS OF QUITTING?  There are many health benefits to quitting smoking.   Likelihood of developing cancer and heart disease decreases. Health improvements are seen almost immediately.  Blood pressure, pulse rate, and breathing patterns start returning to normal soon after quitting. QUITTING SMOKING   American Lung Association - 1-800-LUNGUSA  American Cancer Society - 1-800-ACS-2345 Document Released: 10/31/2004 Document Revised: 12/16/2011 Document Reviewed: 07/05/2009 ExitCare Patient Information 2013 [employer]   LL[employer].   Stress Management Stress is a state of physical or mental tension that often results from changes in your life or normal routine. Some common causes of stress are:  Death of a loved one.  Injuries or severe illnesses.  Getting fired or changing jobs.  Moving into a new home. Other causes may be:  Sexual problems.  Business or financial losses.  Taking on a large debt.  Regular conflict with someone at home or at work.  Constant tiredness from lack of sleep. It is not just bad things that are stressful. It may be stressful to:  Win the lottery.  Get married.  Buy a new car. The amount of stress that can be easily tolerated varies from person to person. Changes generally cause stress, regardless of the types of change. Too much stress can affect your health. It may lead to physical or emotional problems. Too little stress (boredom) may also become stressful. SUGGESTIONS TO  REDUCE STRESS:  Talk things over with your family and friends. It often is helpful to share your concerns and worries. If you feel your problem is serious, you may want to get help from a professional counselor.  Consider your problems one at a time instead of lumping them all together. Trying to take care of everything at once may seem impossible. List all the things you need to do and then start with the most important one. Set a goal to accomplish 2 or 3 things each day. If you expect to do too many in a single day you will naturally fail, causing you to feel even more stressed.  Do not use alcohol or drugs to relieve stress. Although you may feel better for a short time, they do not remove the problems that caused the stress. They can also be habit forming.  Exercise regularly - at least 3 times per week. Physical exercise can help to relieve that "uptight" feeling and will relax you.  The shortest distance between despair and hope is often a good night's sleep.  Go to bed and get up on time allowing yourself time for appointments without being rushed.  Take a short "time-out" period from any stressful situation that occurs during the day. Close your eyes and take some deep breaths. Starting with the muscles in your face, tense them, hold it for a few seconds, then relax. Repeat this with the muscles in your neck, shoulders, hand, stomach, back and legs.  Take good care of yourself. Eat a balanced diet and get plenty of rest.  Schedule time for having fun. Take a break from your daily routine to relax. HOME CARE INSTRUCTIONS   Call if you feel overwhelmed by your problems and feel you can no longer manage them on your own.  Return immediately if you feel like hurting yourself or someone else. Document Released: 03/19/2001 Document Revised: 12/16/2011 Document Reviewed: 11/09/2007 ExitCare Patient Information 2013 [employer].   

## 2015-03-19 NOTE — Progress Notes (Signed)
Reviewed personally.  M. Suzanne Kaytlyn Din, MD.  

## 2015-09-21 ENCOUNTER — Other Ambulatory Visit: Payer: Self-pay | Admitting: Family Medicine

## 2015-09-21 DIAGNOSIS — IMO0002 Reserved for concepts with insufficient information to code with codable children: Secondary | ICD-10-CM

## 2015-09-21 DIAGNOSIS — R229 Localized swelling, mass and lump, unspecified: Principal | ICD-10-CM

## 2015-09-26 ENCOUNTER — Other Ambulatory Visit: Payer: Self-pay

## 2016-01-09 DIAGNOSIS — J301 Allergic rhinitis due to pollen: Secondary | ICD-10-CM | POA: Diagnosis not present

## 2016-01-09 DIAGNOSIS — J3081 Allergic rhinitis due to animal (cat) (dog) hair and dander: Secondary | ICD-10-CM | POA: Diagnosis not present

## 2016-01-11 DIAGNOSIS — J3081 Allergic rhinitis due to animal (cat) (dog) hair and dander: Secondary | ICD-10-CM | POA: Diagnosis not present

## 2016-01-11 DIAGNOSIS — J301 Allergic rhinitis due to pollen: Secondary | ICD-10-CM | POA: Diagnosis not present

## 2016-01-16 DIAGNOSIS — J301 Allergic rhinitis due to pollen: Secondary | ICD-10-CM | POA: Diagnosis not present

## 2016-01-16 DIAGNOSIS — J3081 Allergic rhinitis due to animal (cat) (dog) hair and dander: Secondary | ICD-10-CM | POA: Diagnosis not present

## 2016-01-23 DIAGNOSIS — J301 Allergic rhinitis due to pollen: Secondary | ICD-10-CM | POA: Diagnosis not present

## 2016-01-23 DIAGNOSIS — J3081 Allergic rhinitis due to animal (cat) (dog) hair and dander: Secondary | ICD-10-CM | POA: Diagnosis not present

## 2016-01-25 DIAGNOSIS — J301 Allergic rhinitis due to pollen: Secondary | ICD-10-CM | POA: Diagnosis not present

## 2016-01-25 DIAGNOSIS — B079 Viral wart, unspecified: Secondary | ICD-10-CM | POA: Diagnosis not present

## 2016-01-25 DIAGNOSIS — L245 Irritant contact dermatitis due to other chemical products: Secondary | ICD-10-CM | POA: Diagnosis not present

## 2016-01-29 DIAGNOSIS — J3081 Allergic rhinitis due to animal (cat) (dog) hair and dander: Secondary | ICD-10-CM | POA: Diagnosis not present

## 2016-01-29 DIAGNOSIS — J301 Allergic rhinitis due to pollen: Secondary | ICD-10-CM | POA: Diagnosis not present

## 2016-01-31 DIAGNOSIS — J301 Allergic rhinitis due to pollen: Secondary | ICD-10-CM | POA: Diagnosis not present

## 2016-02-02 DIAGNOSIS — J3081 Allergic rhinitis due to animal (cat) (dog) hair and dander: Secondary | ICD-10-CM | POA: Diagnosis not present

## 2016-02-02 DIAGNOSIS — J301 Allergic rhinitis due to pollen: Secondary | ICD-10-CM | POA: Diagnosis not present

## 2016-02-06 DIAGNOSIS — J301 Allergic rhinitis due to pollen: Secondary | ICD-10-CM | POA: Diagnosis not present

## 2016-02-06 DIAGNOSIS — J3081 Allergic rhinitis due to animal (cat) (dog) hair and dander: Secondary | ICD-10-CM | POA: Diagnosis not present

## 2016-02-14 DIAGNOSIS — J3081 Allergic rhinitis due to animal (cat) (dog) hair and dander: Secondary | ICD-10-CM | POA: Diagnosis not present

## 2016-02-14 DIAGNOSIS — J301 Allergic rhinitis due to pollen: Secondary | ICD-10-CM | POA: Diagnosis not present

## 2016-02-14 DIAGNOSIS — J3089 Other allergic rhinitis: Secondary | ICD-10-CM | POA: Diagnosis not present

## 2016-02-15 DIAGNOSIS — J301 Allergic rhinitis due to pollen: Secondary | ICD-10-CM | POA: Diagnosis not present

## 2016-02-16 DIAGNOSIS — J301 Allergic rhinitis due to pollen: Secondary | ICD-10-CM | POA: Diagnosis not present

## 2016-02-16 DIAGNOSIS — J3089 Other allergic rhinitis: Secondary | ICD-10-CM | POA: Diagnosis not present

## 2016-02-16 DIAGNOSIS — J3081 Allergic rhinitis due to animal (cat) (dog) hair and dander: Secondary | ICD-10-CM | POA: Diagnosis not present

## 2016-03-20 ENCOUNTER — Ambulatory Visit: Payer: BLUE CROSS/BLUE SHIELD | Admitting: Certified Nurse Midwife

## 2016-03-26 ENCOUNTER — Other Ambulatory Visit: Payer: Self-pay | Admitting: Certified Nurse Midwife

## 2016-03-26 NOTE — Telephone Encounter (Signed)
Medication refill request: OCP Last AEX:  03-16-15 Next AEX: 8-17 Last MMG (if hormonal medication request): N/A Refill authorized: please advise

## 2016-05-14 ENCOUNTER — Ambulatory Visit: Payer: BLUE CROSS/BLUE SHIELD | Admitting: Certified Nurse Midwife

## 2016-05-14 ENCOUNTER — Encounter: Payer: Self-pay | Admitting: Certified Nurse Midwife

## 2016-05-14 VITALS — BP 100/76 | HR 72 | Resp 16 | Ht 66.5 in | Wt 176.0 lb

## 2016-05-14 DIAGNOSIS — Z3041 Encounter for surveillance of contraceptive pills: Secondary | ICD-10-CM

## 2016-05-14 DIAGNOSIS — Z0000 Encounter for general adult medical examination without abnormal findings: Secondary | ICD-10-CM

## 2016-05-14 DIAGNOSIS — Z01419 Encounter for gynecological examination (general) (routine) without abnormal findings: Secondary | ICD-10-CM | POA: Diagnosis not present

## 2016-05-14 DIAGNOSIS — Z124 Encounter for screening for malignant neoplasm of cervix: Secondary | ICD-10-CM | POA: Diagnosis not present

## 2016-05-14 MED ORDER — LEVONORGEST-ETH ESTRAD 91-DAY 0.15-0.03 MG PO TABS: 1.0000 | ORAL_TABLET | Freq: Every day | ORAL | 4 refills | Status: DC

## 2016-05-14 NOTE — Patient Instructions (Signed)
General topics  Next pap or exam is  due in 1 year Take a Women's multivitamin Take 1200 mg. of calcium daily - prefer dietary If any concerns in interim to call back  Breast Self-Awareness Practicing breast self-awareness may pick up problems early, prevent significant medical complications, and possibly save your life. By practicing breast self-awareness, you can become familiar with how your breasts look and feel and if your breasts are changing. This allows you to notice changes early. It can also offer you some reassurance that your breast health is good. One way to learn what is normal for your breasts and whether your breasts are changing is to do a breast self-exam. If you find a lump or something that was not present in the past, it is best to contact your caregiver right away. Other findings that should be evaluated by your caregiver include nipple discharge, especially if it is bloody; skin changes or reddening; areas where the skin seems to be pulled in (retracted); or new lumps and bumps. Breast pain is seldom associated with cancer (malignancy), but should also be evaluated by a caregiver. BREAST SELF-EXAM The best time to examine your breasts is 5 7 days after your menstrual period is over.  ExitCare Patient Information 2013 [employer].   Exercise to Stay Healthy Exercise helps you become and stay healthy. EXERCISE IDEAS AND TIPS Choose exercises that:  You enjoy.  Fit into your day. You do not need to exercise really hard to be healthy. You can do exercises at a slow or medium level and stay healthy. You can:  Stretch before and after working out.  Try yoga, Pilates, or tai chi.  Lift weights.  Walk fast, swim, jog, run, climb stairs, bicycle, dance, or rollerskate.  Take aerobic classes. Exercises that burn about 150 calories:  Running 1  miles in 15 minutes.  Playing volleyball for 45 to 60 minutes.  Washing and waxing a car for 45 to 60  minutes.  Playing touch football for 45 minutes.  Walking 1  miles in 35 minutes.  Pushing a stroller 1  miles in 30 minutes.  Playing basketball for 30 minutes.  Raking leaves for 30 minutes.  Bicycling 5 miles in 30 minutes.  Walking 2 miles in 30 minutes.  Dancing for 30 minutes.  Shoveling snow for 15 minutes.  Swimming laps for 20 minutes.  Walking up stairs for 15 minutes.  Bicycling 4 miles in 15 minutes.  Gardening for 30 to 45 minutes.  Jumping rope for 15 minutes.  Washing windows or floors for 45 to 60 minutes. Document Released: 10/26/2010 Document Revised: 12/16/2011 Document Reviewed: 10/26/2010 ExitCare Patient Information 2013 [employer].   Other topics ( that may be useful information):    Sexually Transmitted Disease Sexually transmitted disease (STD) refers to any infection that is passed from person to person during sexual activity. This may happen by way of saliva, semen, blood, vaginal mucus, or urine. Common STDs include:  Gonorrhea.  Chlamydia.  Syphilis.  HIV/AIDS.  Genital herpes.  Hepatitis B and C.  Trichomonas.  Human papillomavirus (HPV).  Pubic lice. CAUSES  An STD may be spread by bacteria, virus, or parasite. A person can get an STD by:  Sexual intercourse with an infected person.  Sharing sex toys with an infected person.  Sharing needles with an infected person.  Having intimate contact with the genitals, mouth, or rectal areas of an infected person. SYMPTOMS  Some people may not have any symptoms, but   they can still pass the infection to others. Different STDs have different symptoms. Symptoms include:  Painful or bloody urination.  Pain in the pelvis, abdomen, vagina, anus, throat, or eyes.  Skin rash, itching, irritation, growths, or sores (lesions). These usually occur in the genital or anal area.  Abnormal vaginal discharge.  Penile discharge in men.  Soft, flesh-colored skin growths in the  genital or anal area.  Fever.  Pain or bleeding during sexual intercourse.  Swollen glands in the groin area.  Yellow skin and eyes (jaundice). This is seen with hepatitis. DIAGNOSIS  To make a diagnosis, your caregiver may:  Take a medical history.  Perform a physical exam.  Take a specimen (culture) to be examined.  Examine a sample of discharge under a microscope.  Perform blood test TREATMENT   Chlamydia, gonorrhea, trichomonas, and syphilis can be cured with antibiotic medicine.  Genital herpes, hepatitis, and HIV can be treated, but not cured, with prescribed medicines. The medicines will lessen the symptoms.  Genital warts from HPV can be treated with medicine or by freezing, burning (electrocautery), or surgery. Warts may come back.  HPV is a virus and cannot be cured with medicine or surgery.However, abnormal areas may be followed very closely by your caregiver and may be removed from the cervix, vagina, or vulva through office procedures or surgery. If your diagnosis is confirmed, your recent sexual partners need treatment. This is true even if they are symptom-free or have a negative culture or evaluation. They should not have sex until their caregiver says it is okay. HOME CARE INSTRUCTIONS  All sexual partners should be informed, tested, and treated for all STDs.  Take your antibiotics as directed. Finish them even if you start to feel better.  Only take over-the-counter or prescription medicines for pain, discomfort, or fever as directed by your caregiver.  Rest.  Eat a balanced diet and drink enough fluids to keep your urine clear or pale yellow.  Do not have sex until treatment is completed and you have followed up with your caregiver. STDs should be checked after treatment.  Keep all follow-up appointments, Pap tests, and blood tests as directed by your caregiver.  Only use latex condoms and water-soluble lubricants during sexual activity. Do not use  petroleum jelly or oils.  Avoid alcohol and illegal drugs.  Get vaccinated for HPV and hepatitis. If you have not received these vaccines in the past, talk to your caregiver about whether one or both might be right for you.  Avoid risky sex practices that can break the skin. The only way to avoid getting an STD is to avoid all sexual activity.Latex condoms and dental dams (for oral sex) will help lessen the risk of getting an STD, but will not completely eliminate the risk. SEEK MEDICAL CARE IF:   You have a fever.  You have any new or worsening symptoms. Document Released: 12/14/2002 Document Revised: 12/16/2011 Document Reviewed: 12/21/2010 [facility] Patient Information 2013 [place].    Domestic Abuse You are being battered or abused if someone close to you hits, pushes, or physically hurts you in any way. You also are being abused if you are forced into activities. You are being sexually abused if you are forced to have sexual contact of any kind. You are being emotionally abused if you are made to feel worthless or if you are constantly threatened. It is important to remember that help is available. No one has the right to abuse you. PREVENTION OF FURTHER  ABUSE  Learn the warning signs of danger. This varies with situations but may include: the use of alcohol, threats, isolation from friends and family, or forced sexual contact. Leave if you feel that violence is going to occur.  If you are attacked or beaten, report it to the police so the abuse is documented. You do not have to press charges. The police can protect you while you or the attackers are leaving. Get the officer's name and badge number and a copy of the report.  Find someone you can trust and tell them what is happening to you: your caregiver, a nurse, clergy member, close friend or family member. Feeling ashamed is natural, but remember that you have done nothing wrong. No one deserves abuse. Document Released:  09/20/2000 Document Revised: 12/16/2011 Document Reviewed: 11/29/2010 ExitCare Patient Information 2013 [employer].    How Much is Too Much Alcohol? Drinking too much alcohol can cause injury, accidents, and health problems. These types of problems can include:   Car crashes.  Falls.  Family fighting (domestic violence).  Drowning.  Fights.  Injuries.  Burns.  Damage to certain organs.  Having a baby with birth defects. ONE DRINK CAN BE TOO MUCH WHEN YOU ARE:  Working.  Pregnant or breastfeeding.  Taking medicines. Ask your doctor.  Driving or planning to drive. If you or someone you know has a drinking problem, get help from a doctor.  Document Released: 07/20/2009 Document Revised: 12/16/2011 Document Reviewed: 07/20/2009 ExitCare Patient Information 2013 [employer].   Smoking Hazards Smoking cigarettes is extremely bad for your health. Tobacco smoke has over 200 known poisons in it. There are over 60 chemicals in tobacco smoke that cause cancer. Some of the chemicals found in cigarette smoke include:   Cyanide.  Benzene.  Formaldehyde.  Methanol (wood alcohol).  Acetylene (fuel used in welding torches).  Ammonia. Cigarette smoke also contains the poisonous gases nitrogen oxide and carbon monoxide.  Cigarette smokers have an increased risk of many serious medical problems and Smoking causes approximately:  90% of all lung cancer deaths in men.  80% of all lung cancer deaths in women.  90% of deaths from chronic obstructive lung disease. Compared with nonsmokers, smoking increases the risk of:  Coronary heart disease by 2 to 4 times.  Stroke by 2 to 4 times.  Men developing lung cancer by 23 times.  Women developing lung cancer by 13 times.  Dying from chronic obstructive lung diseases by 12 times.  . Smoking is the most preventable cause of death and disease in our society.  WHY IS SMOKING ADDICTIVE?  Nicotine is the chemical  agent in tobacco that is capable of causing addiction or dependence.  When you smoke and inhale, nicotine is absorbed rapidly into the bloodstream through your lungs. Nicotine absorbed through the lungs is capable of creating a powerful addiction. Both inhaled and non-inhaled nicotine may be addictive.  Addiction studies of cigarettes and spit tobacco show that addiction to nicotine occurs mainly during the teen years, when young people begin using tobacco products. WHAT ARE THE BENEFITS OF QUITTING?  There are many health benefits to quitting smoking.   Likelihood of developing cancer and heart disease decreases. Health improvements are seen almost immediately.  Blood pressure, pulse rate, and breathing patterns start returning to normal soon after quitting. QUITTING SMOKING   American Lung Association - 1-800-LUNGUSA  American Cancer Society - 1-800-ACS-2345 Document Released: 10/31/2004 Document Revised: 12/16/2011 Document Reviewed: 07/05/2009 ExitCare Patient Information 2013 ExitCare[employer]   LLC.   Stress Management Stress is a state of physical or mental tension that often results from changes in your life or normal routine. Some common causes of stress are:  Death of a loved one.  Injuries or severe illnesses.  Getting fired or changing jobs.  Moving into a new home. Other causes may be:  Sexual problems.  Business or financial losses.  Taking on a large debt.  Regular conflict with someone at home or at work.  Constant tiredness from lack of sleep. It is not just bad things that are stressful. It may be stressful to:  Win the lottery.  Get married.  Buy a new car. The amount of stress that can be easily tolerated varies from person to person. Changes generally cause stress, regardless of the types of change. Too much stress can affect your health. It may lead to physical or emotional problems. Too little stress (boredom) may also become stressful. SUGGESTIONS TO  REDUCE STRESS:  Talk things over with your family and friends. It often is helpful to share your concerns and worries. If you feel your problem is serious, you may want to get help from a professional counselor.  Consider your problems one at a time instead of lumping them all together. Trying to take care of everything at once may seem impossible. List all the things you need to do and then start with the most important one. Set a goal to accomplish 2 or 3 things each day. If you expect to do too many in a single day you will naturally fail, causing you to feel even more stressed.  Do not use alcohol or drugs to relieve stress. Although you may feel better for a short time, they do not remove the problems that caused the stress. They can also be habit forming.  Exercise regularly - at least 3 times per week. Physical exercise can help to relieve that "uptight" feeling and will relax you.  The shortest distance between despair and hope is often a good night's sleep.  Go to bed and get up on time allowing yourself time for appointments without being rushed.  Take a short "time-out" period from any stressful situation that occurs during the day. Close your eyes and take some deep breaths. Starting with the muscles in your face, tense them, hold it for a few seconds, then relax. Repeat this with the muscles in your neck, shoulders, hand, stomach, back and legs.  Take good care of yourself. Eat a balanced diet and get plenty of rest.  Schedule time for having fun. Take a break from your daily routine to relax. HOME CARE INSTRUCTIONS   Call if you feel overwhelmed by your problems and feel you can no longer manage them on your own.  Return immediately if you feel like hurting yourself or someone else. Document Released: 03/19/2001 Document Revised: 12/16/2011 Document Reviewed: 11/09/2007 [facility] Patient Information 2013 [place].

## 2016-05-14 NOTE — Progress Notes (Signed)
20 y.o. G0P0000 Single  Caucasian Fe here for annual exam.Periods normal, no issues. Contraception working well.No partner change for 5 years, no STD concerns.  Continues to have occasional bleeding with sexual activity, but only with wiping.. Aware of ectropion cervix after visit with Dr. Hyacinth MeekerMiller and aware this can happen. Denies any vaginal symptoms or change in discharge. Finishing college this year! Sees Dr. Valentina LucksGriffin PCP for medication mangement and labs as needed. No health issues today.  Patient's last menstrual period was 04/11/2016 (exact date).          Sexually active: Yes.    The current method of family planning is OCP (estrogen/progesterone).    Exercising: Yes.    walking Smoker:  no  Health Maintenance: Pap:  07-14-14 neg MMG:  none Colonoscopy:  none BMD:   none TDaP:  2011 Shingles: no Pneumonia: no Hep C and HIV: unsure Labs: poct urine-neg Self breast exam: not done   reports that she has never smoked. She has never used smokeless tobacco. She reports that she drinks about 0.6 - 1.2 oz of alcohol per week . She reports that she does not use drugs.  Past Medical History:  Diagnosis Date  . ADHD (attention deficit hyperactivity disorder)     Past Surgical History:  Procedure Laterality Date  . WISDOM TOOTH EXTRACTION  2009    Current Outpatient Prescriptions  Medication Sig Dispense Refill  . clindamycin (CLEOCIN T) 1 % external solution as needed.  2  . dexmethylphenidate (FOCALIN) 5 MG tablet Take 5 mg by mouth as needed.    . DRYSOL 20 % external solution as needed.  2  . levocetirizine (XYZAL) 5 MG tablet Take 5 mg by mouth every evening.    Marland Kitchen. levonorgestrel-ethinyl estradiol (SEASONALE,INTROVALE,JOLESSA) 0.15-0.03 MG tablet TAKE 1 TABLET BY MOUTH DAILY 91 tablet 0  . montelukast (SINGULAIR) 10 MG tablet Take 10 mg by mouth at bedtime.    Marland Kitchen. VYVANSE 30 MG capsule Take 30 mg by mouth every morning.  0   No current facility-administered medications for this  visit.     Family History  Problem Relation Age of Onset  . Diabetes Mother     ROS:  Pertinent items are noted in HPI.  Otherwise, a comprehensive ROS was negative.  Exam:   BP 100/76 (BP Location: Right Arm, Patient Position: Sitting, Cuff Size: Normal)   Pulse 72   Resp 16   Ht 5' 6.5" (1.689 m)   Wt 176 lb (79.8 kg)   LMP 04/11/2016 (Exact Date) Comment: cycle every 3mths  BMI 27.98 kg/m  Height: 5' 6.5" (168.9 cm) Ht Readings from Last 3 Encounters:  05/14/16 5' 6.5" (1.689 m)  03/16/15 5\' 7"  (1.702 m)  10/21/14 5' 6.5" (1.689 m)    General appearance: alert, cooperative and appears stated age Head: Normocephalic, without obvious abnormality, atraumatic Neck: no adenopathy, supple, symmetrical, trachea midline and thyroid normal to inspection and palpation Lungs: clear to auscultation bilaterally Breasts: normal appearance, no masses or tenderness, No nipple retraction or dimpling, No nipple discharge or bleeding, No axillary or supraclavicular adenopathy Heart: regular rate and rhythm Abdomen: soft, non-tender; no masses,  no organomegaly Extremities: extremities normal, atraumatic, no cyanosis or edema Skin: Skin color, texture, turgor normal. No rashes or lesions Lymph nodes: Cervical, supraclavicular, and axillary nodes normal. No abnormal inguinal nodes palpated Neurologic: Grossly normal   Pelvic: External genitalia:  no lesions              Urethra:  normal appearing urethra with no masses, tenderness or lesions              Bartholin's and Skene's: normal                 Vagina: normal appearing vagina with normal color and discharge, no lesions              Cervix: no cervical motion tenderness, no lesions, nulliparous appearance and ectropion cervix               Pap taken: Yes.   bleeding with pap only Bimanual Exam:  Uterus:  normal size, contour, position, consistency, mobility, non-tender              Adnexa: normal adnexa and no mass, fullness,  tenderness               Rectovaginal: Confirms               Anus:  normal appearance  Chaperone present: yes  A:  Well Woman with normal exam  Contraception  OCP desired  Ectropion cervix, no lesions noted  P:   Reviewed health and wellness pertinent to exam  Rx Jolessa see order with instructions  Discussed cervical appearance unchanged and no polyp noted or other lesions. Patient will advise if spotting changes.  Pap smear as above with HPV reflex   counseled on breast self exam, STD prevention, HIV risk factors and prevention, use and side effects of OCP's, adequate intake of calcium and vitamin D, diet and exercise  return annually or prn  An After Visit Summary was printed and given to the patient.

## 2016-05-15 DIAGNOSIS — Z124 Encounter for screening for malignant neoplasm of cervix: Secondary | ICD-10-CM | POA: Diagnosis not present

## 2016-05-16 LAB — IPS PAP TEST WITH REFLEX TO HPV

## 2016-05-17 DIAGNOSIS — J3089 Other allergic rhinitis: Secondary | ICD-10-CM | POA: Diagnosis not present

## 2016-05-17 DIAGNOSIS — J301 Allergic rhinitis due to pollen: Secondary | ICD-10-CM | POA: Diagnosis not present

## 2016-05-17 DIAGNOSIS — J3081 Allergic rhinitis due to animal (cat) (dog) hair and dander: Secondary | ICD-10-CM | POA: Diagnosis not present

## 2016-05-17 LAB — POCT URINALYSIS DIPSTICK
BILIRUBIN UA: NEGATIVE
GLUCOSE UA: NEGATIVE
KETONES UA: NEGATIVE
Leukocytes, UA: NEGATIVE
NITRITE UA: NEGATIVE
PH UA: 5
Protein, UA: NEGATIVE
RBC UA: NEGATIVE
Urobilinogen, UA: NEGATIVE

## 2016-05-20 DIAGNOSIS — J3089 Other allergic rhinitis: Secondary | ICD-10-CM | POA: Diagnosis not present

## 2016-05-20 DIAGNOSIS — J301 Allergic rhinitis due to pollen: Secondary | ICD-10-CM | POA: Diagnosis not present

## 2016-05-20 DIAGNOSIS — J3081 Allergic rhinitis due to animal (cat) (dog) hair and dander: Secondary | ICD-10-CM | POA: Diagnosis not present

## 2016-05-20 NOTE — Progress Notes (Signed)
Encounter reviewed Tesla Bochicchio, MD   

## 2016-06-04 DIAGNOSIS — J3089 Other allergic rhinitis: Secondary | ICD-10-CM | POA: Diagnosis not present

## 2016-06-04 DIAGNOSIS — J3081 Allergic rhinitis due to animal (cat) (dog) hair and dander: Secondary | ICD-10-CM | POA: Diagnosis not present

## 2016-06-04 DIAGNOSIS — J301 Allergic rhinitis due to pollen: Secondary | ICD-10-CM | POA: Diagnosis not present

## 2016-06-12 DIAGNOSIS — J301 Allergic rhinitis due to pollen: Secondary | ICD-10-CM | POA: Diagnosis not present

## 2016-06-12 DIAGNOSIS — J3081 Allergic rhinitis due to animal (cat) (dog) hair and dander: Secondary | ICD-10-CM | POA: Diagnosis not present

## 2016-06-17 DIAGNOSIS — J3081 Allergic rhinitis due to animal (cat) (dog) hair and dander: Secondary | ICD-10-CM | POA: Diagnosis not present

## 2016-06-17 DIAGNOSIS — J301 Allergic rhinitis due to pollen: Secondary | ICD-10-CM | POA: Diagnosis not present

## 2016-06-19 DIAGNOSIS — J3081 Allergic rhinitis due to animal (cat) (dog) hair and dander: Secondary | ICD-10-CM | POA: Diagnosis not present

## 2016-06-19 DIAGNOSIS — J301 Allergic rhinitis due to pollen: Secondary | ICD-10-CM | POA: Diagnosis not present

## 2016-06-24 DIAGNOSIS — J3081 Allergic rhinitis due to animal (cat) (dog) hair and dander: Secondary | ICD-10-CM | POA: Diagnosis not present

## 2016-06-24 DIAGNOSIS — J301 Allergic rhinitis due to pollen: Secondary | ICD-10-CM | POA: Diagnosis not present

## 2016-06-26 DIAGNOSIS — J3081 Allergic rhinitis due to animal (cat) (dog) hair and dander: Secondary | ICD-10-CM | POA: Diagnosis not present

## 2016-06-26 DIAGNOSIS — J301 Allergic rhinitis due to pollen: Secondary | ICD-10-CM | POA: Diagnosis not present

## 2016-07-03 DIAGNOSIS — J3081 Allergic rhinitis due to animal (cat) (dog) hair and dander: Secondary | ICD-10-CM | POA: Diagnosis not present

## 2016-07-03 DIAGNOSIS — J301 Allergic rhinitis due to pollen: Secondary | ICD-10-CM | POA: Diagnosis not present

## 2016-07-08 DIAGNOSIS — J3081 Allergic rhinitis due to animal (cat) (dog) hair and dander: Secondary | ICD-10-CM | POA: Diagnosis not present

## 2016-07-08 DIAGNOSIS — J301 Allergic rhinitis due to pollen: Secondary | ICD-10-CM | POA: Diagnosis not present

## 2016-07-10 DIAGNOSIS — J3081 Allergic rhinitis due to animal (cat) (dog) hair and dander: Secondary | ICD-10-CM | POA: Diagnosis not present

## 2016-07-10 DIAGNOSIS — J301 Allergic rhinitis due to pollen: Secondary | ICD-10-CM | POA: Diagnosis not present

## 2016-07-17 DIAGNOSIS — J301 Allergic rhinitis due to pollen: Secondary | ICD-10-CM | POA: Diagnosis not present

## 2016-07-17 DIAGNOSIS — J3081 Allergic rhinitis due to animal (cat) (dog) hair and dander: Secondary | ICD-10-CM | POA: Diagnosis not present

## 2016-07-23 DIAGNOSIS — Z23 Encounter for immunization: Secondary | ICD-10-CM | POA: Diagnosis not present

## 2016-07-26 DIAGNOSIS — J3081 Allergic rhinitis due to animal (cat) (dog) hair and dander: Secondary | ICD-10-CM | POA: Diagnosis not present

## 2016-07-26 DIAGNOSIS — J301 Allergic rhinitis due to pollen: Secondary | ICD-10-CM | POA: Diagnosis not present

## 2016-08-22 DIAGNOSIS — J301 Allergic rhinitis due to pollen: Secondary | ICD-10-CM | POA: Diagnosis not present

## 2016-08-22 DIAGNOSIS — J3089 Other allergic rhinitis: Secondary | ICD-10-CM | POA: Diagnosis not present

## 2016-08-22 DIAGNOSIS — J3081 Allergic rhinitis due to animal (cat) (dog) hair and dander: Secondary | ICD-10-CM | POA: Diagnosis not present

## 2016-08-28 DIAGNOSIS — J3089 Other allergic rhinitis: Secondary | ICD-10-CM | POA: Diagnosis not present

## 2016-08-28 DIAGNOSIS — J3081 Allergic rhinitis due to animal (cat) (dog) hair and dander: Secondary | ICD-10-CM | POA: Diagnosis not present

## 2016-08-28 DIAGNOSIS — J301 Allergic rhinitis due to pollen: Secondary | ICD-10-CM | POA: Diagnosis not present

## 2016-09-12 DIAGNOSIS — J301 Allergic rhinitis due to pollen: Secondary | ICD-10-CM | POA: Diagnosis not present

## 2016-09-12 DIAGNOSIS — J3089 Other allergic rhinitis: Secondary | ICD-10-CM | POA: Diagnosis not present

## 2016-09-12 DIAGNOSIS — J3081 Allergic rhinitis due to animal (cat) (dog) hair and dander: Secondary | ICD-10-CM | POA: Diagnosis not present

## 2016-09-18 DIAGNOSIS — J301 Allergic rhinitis due to pollen: Secondary | ICD-10-CM | POA: Diagnosis not present

## 2016-09-18 DIAGNOSIS — J3081 Allergic rhinitis due to animal (cat) (dog) hair and dander: Secondary | ICD-10-CM | POA: Diagnosis not present

## 2016-09-23 DIAGNOSIS — J301 Allergic rhinitis due to pollen: Secondary | ICD-10-CM | POA: Diagnosis not present

## 2016-09-23 DIAGNOSIS — J3089 Other allergic rhinitis: Secondary | ICD-10-CM | POA: Diagnosis not present

## 2016-09-23 DIAGNOSIS — J3081 Allergic rhinitis due to animal (cat) (dog) hair and dander: Secondary | ICD-10-CM | POA: Diagnosis not present

## 2016-09-25 DIAGNOSIS — J3081 Allergic rhinitis due to animal (cat) (dog) hair and dander: Secondary | ICD-10-CM | POA: Diagnosis not present

## 2016-09-25 DIAGNOSIS — J3089 Other allergic rhinitis: Secondary | ICD-10-CM | POA: Diagnosis not present

## 2016-09-25 DIAGNOSIS — J301 Allergic rhinitis due to pollen: Secondary | ICD-10-CM | POA: Diagnosis not present

## 2016-10-02 DIAGNOSIS — J301 Allergic rhinitis due to pollen: Secondary | ICD-10-CM | POA: Diagnosis not present

## 2016-10-02 DIAGNOSIS — R05 Cough: Secondary | ICD-10-CM | POA: Diagnosis not present

## 2016-10-02 DIAGNOSIS — J3081 Allergic rhinitis due to animal (cat) (dog) hair and dander: Secondary | ICD-10-CM | POA: Diagnosis not present

## 2016-10-02 DIAGNOSIS — J3089 Other allergic rhinitis: Secondary | ICD-10-CM | POA: Diagnosis not present

## 2016-10-10 DIAGNOSIS — F900 Attention-deficit hyperactivity disorder, predominantly inattentive type: Secondary | ICD-10-CM | POA: Diagnosis not present

## 2016-10-10 DIAGNOSIS — J309 Allergic rhinitis, unspecified: Secondary | ICD-10-CM | POA: Diagnosis not present

## 2016-10-10 DIAGNOSIS — F419 Anxiety disorder, unspecified: Secondary | ICD-10-CM | POA: Diagnosis not present

## 2016-10-10 DIAGNOSIS — Z0000 Encounter for general adult medical examination without abnormal findings: Secondary | ICD-10-CM | POA: Diagnosis not present

## 2016-10-11 DIAGNOSIS — J3089 Other allergic rhinitis: Secondary | ICD-10-CM | POA: Diagnosis not present

## 2016-10-11 DIAGNOSIS — J301 Allergic rhinitis due to pollen: Secondary | ICD-10-CM | POA: Diagnosis not present

## 2016-10-11 DIAGNOSIS — J3081 Allergic rhinitis due to animal (cat) (dog) hair and dander: Secondary | ICD-10-CM | POA: Diagnosis not present

## 2016-11-01 DIAGNOSIS — J3081 Allergic rhinitis due to animal (cat) (dog) hair and dander: Secondary | ICD-10-CM | POA: Diagnosis not present

## 2016-11-01 DIAGNOSIS — J301 Allergic rhinitis due to pollen: Secondary | ICD-10-CM | POA: Diagnosis not present

## 2016-11-01 DIAGNOSIS — J3089 Other allergic rhinitis: Secondary | ICD-10-CM | POA: Diagnosis not present

## 2016-11-03 DIAGNOSIS — L2089 Other atopic dermatitis: Secondary | ICD-10-CM | POA: Diagnosis not present

## 2016-11-21 DIAGNOSIS — J3081 Allergic rhinitis due to animal (cat) (dog) hair and dander: Secondary | ICD-10-CM | POA: Diagnosis not present

## 2016-11-21 DIAGNOSIS — J301 Allergic rhinitis due to pollen: Secondary | ICD-10-CM | POA: Diagnosis not present

## 2016-11-27 DIAGNOSIS — J301 Allergic rhinitis due to pollen: Secondary | ICD-10-CM | POA: Diagnosis not present

## 2016-12-03 DIAGNOSIS — J3081 Allergic rhinitis due to animal (cat) (dog) hair and dander: Secondary | ICD-10-CM | POA: Diagnosis not present

## 2016-12-03 DIAGNOSIS — J301 Allergic rhinitis due to pollen: Secondary | ICD-10-CM | POA: Diagnosis not present

## 2016-12-05 DIAGNOSIS — J301 Allergic rhinitis due to pollen: Secondary | ICD-10-CM | POA: Diagnosis not present

## 2016-12-24 DIAGNOSIS — J301 Allergic rhinitis due to pollen: Secondary | ICD-10-CM | POA: Diagnosis not present

## 2016-12-31 DIAGNOSIS — J301 Allergic rhinitis due to pollen: Secondary | ICD-10-CM | POA: Diagnosis not present

## 2017-01-06 DIAGNOSIS — J301 Allergic rhinitis due to pollen: Secondary | ICD-10-CM | POA: Diagnosis not present

## 2017-01-16 DIAGNOSIS — J3089 Other allergic rhinitis: Secondary | ICD-10-CM | POA: Diagnosis not present

## 2017-01-16 DIAGNOSIS — J301 Allergic rhinitis due to pollen: Secondary | ICD-10-CM | POA: Diagnosis not present

## 2017-01-16 DIAGNOSIS — J3081 Allergic rhinitis due to animal (cat) (dog) hair and dander: Secondary | ICD-10-CM | POA: Diagnosis not present

## 2017-01-21 DIAGNOSIS — J301 Allergic rhinitis due to pollen: Secondary | ICD-10-CM | POA: Diagnosis not present

## 2017-01-24 DIAGNOSIS — B078 Other viral warts: Secondary | ICD-10-CM | POA: Diagnosis not present

## 2017-01-24 DIAGNOSIS — D2261 Melanocytic nevi of right upper limb, including shoulder: Secondary | ICD-10-CM | POA: Diagnosis not present

## 2017-01-24 DIAGNOSIS — D224 Melanocytic nevi of scalp and neck: Secondary | ICD-10-CM | POA: Diagnosis not present

## 2017-01-28 DIAGNOSIS — J301 Allergic rhinitis due to pollen: Secondary | ICD-10-CM | POA: Diagnosis not present

## 2017-02-12 DIAGNOSIS — J301 Allergic rhinitis due to pollen: Secondary | ICD-10-CM | POA: Diagnosis not present

## 2017-02-16 DIAGNOSIS — J019 Acute sinusitis, unspecified: Secondary | ICD-10-CM | POA: Diagnosis not present

## 2017-02-24 DIAGNOSIS — J309 Allergic rhinitis, unspecified: Secondary | ICD-10-CM | POA: Diagnosis not present

## 2017-03-04 DIAGNOSIS — J3089 Other allergic rhinitis: Secondary | ICD-10-CM | POA: Diagnosis not present

## 2017-03-04 DIAGNOSIS — J3081 Allergic rhinitis due to animal (cat) (dog) hair and dander: Secondary | ICD-10-CM | POA: Diagnosis not present

## 2017-03-04 DIAGNOSIS — J301 Allergic rhinitis due to pollen: Secondary | ICD-10-CM | POA: Diagnosis not present

## 2017-03-12 DIAGNOSIS — J3081 Allergic rhinitis due to animal (cat) (dog) hair and dander: Secondary | ICD-10-CM | POA: Diagnosis not present

## 2017-03-12 DIAGNOSIS — J301 Allergic rhinitis due to pollen: Secondary | ICD-10-CM | POA: Diagnosis not present

## 2017-03-12 DIAGNOSIS — J3089 Other allergic rhinitis: Secondary | ICD-10-CM | POA: Diagnosis not present

## 2017-03-26 DIAGNOSIS — J3081 Allergic rhinitis due to animal (cat) (dog) hair and dander: Secondary | ICD-10-CM | POA: Diagnosis not present

## 2017-03-26 DIAGNOSIS — J3089 Other allergic rhinitis: Secondary | ICD-10-CM | POA: Diagnosis not present

## 2017-03-26 DIAGNOSIS — J301 Allergic rhinitis due to pollen: Secondary | ICD-10-CM | POA: Diagnosis not present

## 2017-04-02 DIAGNOSIS — J3089 Other allergic rhinitis: Secondary | ICD-10-CM | POA: Diagnosis not present

## 2017-04-02 DIAGNOSIS — J3081 Allergic rhinitis due to animal (cat) (dog) hair and dander: Secondary | ICD-10-CM | POA: Diagnosis not present

## 2017-04-02 DIAGNOSIS — J301 Allergic rhinitis due to pollen: Secondary | ICD-10-CM | POA: Diagnosis not present

## 2017-04-08 DIAGNOSIS — J3089 Other allergic rhinitis: Secondary | ICD-10-CM | POA: Diagnosis not present

## 2017-04-08 DIAGNOSIS — J3081 Allergic rhinitis due to animal (cat) (dog) hair and dander: Secondary | ICD-10-CM | POA: Diagnosis not present

## 2017-04-08 DIAGNOSIS — J301 Allergic rhinitis due to pollen: Secondary | ICD-10-CM | POA: Diagnosis not present

## 2017-04-18 DIAGNOSIS — J3089 Other allergic rhinitis: Secondary | ICD-10-CM | POA: Diagnosis not present

## 2017-04-18 DIAGNOSIS — F419 Anxiety disorder, unspecified: Secondary | ICD-10-CM | POA: Diagnosis not present

## 2017-04-18 DIAGNOSIS — J301 Allergic rhinitis due to pollen: Secondary | ICD-10-CM | POA: Diagnosis not present

## 2017-04-18 DIAGNOSIS — J3081 Allergic rhinitis due to animal (cat) (dog) hair and dander: Secondary | ICD-10-CM | POA: Diagnosis not present

## 2017-04-18 DIAGNOSIS — F900 Attention-deficit hyperactivity disorder, predominantly inattentive type: Secondary | ICD-10-CM | POA: Diagnosis not present

## 2017-04-30 DIAGNOSIS — J301 Allergic rhinitis due to pollen: Secondary | ICD-10-CM | POA: Diagnosis not present

## 2017-04-30 DIAGNOSIS — J3089 Other allergic rhinitis: Secondary | ICD-10-CM | POA: Diagnosis not present

## 2017-04-30 DIAGNOSIS — J3081 Allergic rhinitis due to animal (cat) (dog) hair and dander: Secondary | ICD-10-CM | POA: Diagnosis not present

## 2017-05-07 DIAGNOSIS — J301 Allergic rhinitis due to pollen: Secondary | ICD-10-CM | POA: Diagnosis not present

## 2017-05-07 DIAGNOSIS — J3089 Other allergic rhinitis: Secondary | ICD-10-CM | POA: Diagnosis not present

## 2017-05-07 DIAGNOSIS — J3081 Allergic rhinitis due to animal (cat) (dog) hair and dander: Secondary | ICD-10-CM | POA: Diagnosis not present

## 2017-05-09 DIAGNOSIS — J301 Allergic rhinitis due to pollen: Secondary | ICD-10-CM | POA: Diagnosis not present

## 2017-05-12 DIAGNOSIS — J3089 Other allergic rhinitis: Secondary | ICD-10-CM | POA: Diagnosis not present

## 2017-05-12 DIAGNOSIS — J3081 Allergic rhinitis due to animal (cat) (dog) hair and dander: Secondary | ICD-10-CM | POA: Diagnosis not present

## 2017-05-14 DIAGNOSIS — J3081 Allergic rhinitis due to animal (cat) (dog) hair and dander: Secondary | ICD-10-CM | POA: Diagnosis not present

## 2017-05-14 DIAGNOSIS — J301 Allergic rhinitis due to pollen: Secondary | ICD-10-CM | POA: Diagnosis not present

## 2017-05-14 DIAGNOSIS — J3089 Other allergic rhinitis: Secondary | ICD-10-CM | POA: Diagnosis not present

## 2017-05-16 ENCOUNTER — Ambulatory Visit: Payer: BLUE CROSS/BLUE SHIELD | Admitting: Certified Nurse Midwife

## 2017-05-28 DIAGNOSIS — J3081 Allergic rhinitis due to animal (cat) (dog) hair and dander: Secondary | ICD-10-CM | POA: Diagnosis not present

## 2017-05-28 DIAGNOSIS — J301 Allergic rhinitis due to pollen: Secondary | ICD-10-CM | POA: Diagnosis not present

## 2017-05-28 DIAGNOSIS — J3089 Other allergic rhinitis: Secondary | ICD-10-CM | POA: Diagnosis not present

## 2017-06-11 DIAGNOSIS — J301 Allergic rhinitis due to pollen: Secondary | ICD-10-CM | POA: Diagnosis not present

## 2017-06-11 DIAGNOSIS — J3089 Other allergic rhinitis: Secondary | ICD-10-CM | POA: Diagnosis not present

## 2017-06-11 DIAGNOSIS — J3081 Allergic rhinitis due to animal (cat) (dog) hair and dander: Secondary | ICD-10-CM | POA: Diagnosis not present

## 2017-06-18 DIAGNOSIS — J301 Allergic rhinitis due to pollen: Secondary | ICD-10-CM | POA: Diagnosis not present

## 2017-06-18 DIAGNOSIS — J3089 Other allergic rhinitis: Secondary | ICD-10-CM | POA: Diagnosis not present

## 2017-06-18 DIAGNOSIS — J3081 Allergic rhinitis due to animal (cat) (dog) hair and dander: Secondary | ICD-10-CM | POA: Diagnosis not present

## 2017-06-23 DIAGNOSIS — J3089 Other allergic rhinitis: Secondary | ICD-10-CM | POA: Diagnosis not present

## 2017-06-23 DIAGNOSIS — J301 Allergic rhinitis due to pollen: Secondary | ICD-10-CM | POA: Diagnosis not present

## 2017-06-23 DIAGNOSIS — J3081 Allergic rhinitis due to animal (cat) (dog) hair and dander: Secondary | ICD-10-CM | POA: Diagnosis not present

## 2017-06-24 ENCOUNTER — Other Ambulatory Visit: Payer: Self-pay

## 2017-06-24 DIAGNOSIS — Z3041 Encounter for surveillance of contraceptive pills: Secondary | ICD-10-CM

## 2017-06-24 MED ORDER — LEVONORGEST-ETH ESTRAD 91-DAY 0.15-0.03 MG PO TABS: 1.0000 | ORAL_TABLET | Freq: Every day | ORAL | 0 refills | Status: DC

## 2017-06-24 NOTE — Telephone Encounter (Signed)
Medication refill request: Levonorgestrel-ethinyl estradiol Last AEX:  05/14/16 DL Next AEX: 16/10/96 Last MMG (if hormonal medication request): n/a Refill authorized: 05/14/16 #91 w/4; today please advise

## 2017-06-25 DIAGNOSIS — J3089 Other allergic rhinitis: Secondary | ICD-10-CM | POA: Diagnosis not present

## 2017-06-25 DIAGNOSIS — J301 Allergic rhinitis due to pollen: Secondary | ICD-10-CM | POA: Diagnosis not present

## 2017-06-25 DIAGNOSIS — J3081 Allergic rhinitis due to animal (cat) (dog) hair and dander: Secondary | ICD-10-CM | POA: Diagnosis not present

## 2017-06-26 ENCOUNTER — Ambulatory Visit: Payer: BLUE CROSS/BLUE SHIELD | Admitting: Certified Nurse Midwife

## 2017-06-30 ENCOUNTER — Other Ambulatory Visit: Payer: Self-pay | Admitting: Certified Nurse Midwife

## 2017-06-30 DIAGNOSIS — Z3041 Encounter for surveillance of contraceptive pills: Secondary | ICD-10-CM

## 2017-06-30 DIAGNOSIS — J3081 Allergic rhinitis due to animal (cat) (dog) hair and dander: Secondary | ICD-10-CM | POA: Diagnosis not present

## 2017-06-30 DIAGNOSIS — J301 Allergic rhinitis due to pollen: Secondary | ICD-10-CM | POA: Diagnosis not present

## 2017-06-30 DIAGNOSIS — J3089 Other allergic rhinitis: Secondary | ICD-10-CM | POA: Diagnosis not present

## 2017-07-02 DIAGNOSIS — J3081 Allergic rhinitis due to animal (cat) (dog) hair and dander: Secondary | ICD-10-CM | POA: Diagnosis not present

## 2017-07-02 DIAGNOSIS — J301 Allergic rhinitis due to pollen: Secondary | ICD-10-CM | POA: Diagnosis not present

## 2017-07-02 DIAGNOSIS — J3089 Other allergic rhinitis: Secondary | ICD-10-CM | POA: Diagnosis not present

## 2017-07-09 DIAGNOSIS — J3089 Other allergic rhinitis: Secondary | ICD-10-CM | POA: Diagnosis not present

## 2017-07-09 DIAGNOSIS — J301 Allergic rhinitis due to pollen: Secondary | ICD-10-CM | POA: Diagnosis not present

## 2017-07-09 DIAGNOSIS — J3081 Allergic rhinitis due to animal (cat) (dog) hair and dander: Secondary | ICD-10-CM | POA: Diagnosis not present

## 2017-07-16 DIAGNOSIS — Z23 Encounter for immunization: Secondary | ICD-10-CM | POA: Diagnosis not present

## 2017-07-18 DIAGNOSIS — J301 Allergic rhinitis due to pollen: Secondary | ICD-10-CM | POA: Diagnosis not present

## 2017-07-18 DIAGNOSIS — J3089 Other allergic rhinitis: Secondary | ICD-10-CM | POA: Diagnosis not present

## 2017-07-18 DIAGNOSIS — J3081 Allergic rhinitis due to animal (cat) (dog) hair and dander: Secondary | ICD-10-CM | POA: Diagnosis not present

## 2017-07-23 DIAGNOSIS — J3089 Other allergic rhinitis: Secondary | ICD-10-CM | POA: Diagnosis not present

## 2017-07-23 DIAGNOSIS — J301 Allergic rhinitis due to pollen: Secondary | ICD-10-CM | POA: Diagnosis not present

## 2017-07-23 DIAGNOSIS — J3081 Allergic rhinitis due to animal (cat) (dog) hair and dander: Secondary | ICD-10-CM | POA: Diagnosis not present

## 2017-07-25 ENCOUNTER — Ambulatory Visit (INDEPENDENT_AMBULATORY_CARE_PROVIDER_SITE_OTHER): Payer: BLUE CROSS/BLUE SHIELD | Admitting: Certified Nurse Midwife

## 2017-07-25 ENCOUNTER — Encounter: Payer: Self-pay | Admitting: Certified Nurse Midwife

## 2017-07-25 VITALS — BP 104/66 | HR 68 | Resp 16 | Ht 67.0 in | Wt 171.0 lb

## 2017-07-25 DIAGNOSIS — J3089 Other allergic rhinitis: Secondary | ICD-10-CM | POA: Diagnosis not present

## 2017-07-25 DIAGNOSIS — Z01419 Encounter for gynecological examination (general) (routine) without abnormal findings: Secondary | ICD-10-CM

## 2017-07-25 DIAGNOSIS — Z3041 Encounter for surveillance of contraceptive pills: Secondary | ICD-10-CM

## 2017-07-25 DIAGNOSIS — J3081 Allergic rhinitis due to animal (cat) (dog) hair and dander: Secondary | ICD-10-CM | POA: Diagnosis not present

## 2017-07-25 DIAGNOSIS — Z8659 Personal history of other mental and behavioral disorders: Secondary | ICD-10-CM | POA: Diagnosis not present

## 2017-07-25 DIAGNOSIS — J301 Allergic rhinitis due to pollen: Secondary | ICD-10-CM | POA: Diagnosis not present

## 2017-07-25 MED ORDER — LEVONORGEST-ETH ESTRAD 91-DAY 0.15-0.03 MG PO TABS: 1.0000 | ORAL_TABLET | Freq: Every day | ORAL | 4 refills | Status: DC

## 2017-07-25 NOTE — Progress Notes (Signed)
21 y.o. G0P0000 Single  Caucasian Fe here for annual exam. Periods normal, no changes. Contraception OCP working well. No partner change for 5 years. No STD screening. Graduated from college and works for [employer]. Sees Maurice SmallElaine Griffin MD for aex and anxiety management. Sees her every 3 months. Working on weight loss and down 5 pounds. Still occasional occurrence of spotting with intercourse, but aware this is not an issues with history of cervix appearance. No other health issues today. Planning beach trip!  Patient's last menstrual period was 07/01/2017.          Sexually active: Yes.    The current method of family planning is OCP (estrogen/progesterone).    Exercising: Yes.    walking & hot yoga Smoker:  no  Health Maintenance: Pap:  07-14-14 neg, 05-15-16 neg History of Abnormal Pap: no MMG:  none Self Breast exams: no Colonoscopy:  none BMD:   none TDaP:  2011 Shingles: no Pneumonia: no Hep C and HIV: not done Labs: if needed   reports that she has never smoked. She has never used smokeless tobacco. She reports that she drinks about 0.6 - 1.2 oz of alcohol per week . She reports that she does not use drugs.  Past Medical History:  Diagnosis Date  . ADHD (attention deficit hyperactivity disorder)     Past Surgical History:  Procedure Laterality Date  . WISDOM TOOTH EXTRACTION  2009    Current Outpatient Prescriptions  Medication Sig Dispense Refill  . busPIRone (BUSPAR) 10 MG tablet TAKE 1 TABLET BY MOUTH UP TO 2 TIMES A DAY AS NEEDED FOR ANXIETY  5  . clindamycin (CLEOCIN T) 1 % external solution as needed.  2  . dexmethylphenidate (FOCALIN) 5 MG tablet Take 5 mg by mouth as needed.    . DRYSOL 20 % external solution as needed.  2  . Fexofenadine-Pseudoephedrine (ALLEGRA-D PO) Take by mouth as needed.    Marland Kitchen. levocetirizine (XYZAL) 5 MG tablet Take 5 mg by mouth every evening.    Marland Kitchen. levonorgestrel-ethinyl estradiol (SEASONALE,INTROVALE,JOLESSA) 0.15-0.03 MG tablet Take 1  tablet by mouth daily. 91 tablet 0  . montelukast (SINGULAIR) 10 MG tablet Take 10 mg by mouth at bedtime.    Marland Kitchen. VYVANSE 20 MG capsule TAKE 1 CAPSULE BY MOUTH EVERY DAY IN THE MORNING  0   No current facility-administered medications for this visit.     Family History  Problem Relation Age of Onset  . Diabetes Mother     ROS:  Pertinent items are noted in HPI.  Otherwise, a comprehensive ROS was negative.  Exam:   BP 104/66   Pulse 68   Resp 16   Ht 5\' 7"  (1.702 m)   Wt 171 lb (77.6 kg)   LMP 07/01/2017   BMI 26.78 kg/m  Height: 5\' 7"  (170.2 cm) Ht Readings from Last 3 Encounters:  07/25/17 5\' 7"  (1.702 m)  05/14/16 5' 6.5" (1.689 m)  03/16/15 5\' 7"  (1.702 m)    General appearance: alert, cooperative and appears stated age Head: Normocephalic, without obvious abnormality, atraumatic Neck: no adenopathy, supple, symmetrical, trachea midline and thyroid normal to inspection and palpation Lungs: clear to auscultation bilaterally Breasts: normal appearance, no masses or tenderness, No nipple retraction or dimpling, No nipple discharge or bleeding, No axillary or supraclavicular adenopathy Heart: regular rate and rhythm Abdomen: soft, non-tender; no masses,  no organomegaly Extremities: extremities normal, atraumatic, no cyanosis or edema Skin: Skin color, texture, turgor normal. No rashes or lesions Lymph  nodes: Cervical, supraclavicular, and axillary nodes normal. No abnormal inguinal nodes palpated Neurologic: Grossly normal   Pelvic: External genitalia:  no lesions              Urethra:  normal appearing urethra with no masses, tenderness or lesions              Bartholin's and Skene's: normal                 Vagina: normal appearing vagina with normal color and discharge, no lesions              Cervix: no cervical motion tenderness, no lesions and nulliparous appearance              Pap taken: No. Bimanual Exam:  Uterus:  normal size, contour, position, consistency,  mobility, non-tender              Adnexa: normal adnexa and no mass, fullness, tenderness               Rectovaginal: Confirms               Anus:  normal   Chaperone present: yes  A:  Well Woman with normal exam  Contraception OCP desired  Anxiety with medication management with PCP  History of spotting with intercourse, no changes  P:   Reviewed health and wellness pertinent to exam  Reviewed warning signs of OCP. Desires continuance  Rx Seasonale see order with instructions  Continue follow up with PCP as indicated  Pap smear: no   counseled on breast self exam, STD prevention, HIV risk factors and prevention, use and side effects of OCP's, adequate intake of calcium and vitamin D, diet and exercise  return annually or prn  An After Visit Summary was printed and given to the patient.

## 2017-07-25 NOTE — Patient Instructions (Signed)
General topics  Next pap or exam is  due in 1 year Take a Women's multivitamin Take 1200 mg. of calcium daily - prefer dietary If any concerns in interim to call back  Breast Self-Awareness Practicing breast self-awareness may pick up problems early, prevent significant medical complications, and possibly save your life. By practicing breast self-awareness, you can become familiar with how your breasts look and feel and if your breasts are changing. This allows you to notice changes early. It can also offer you some reassurance that your breast health is good. One way to learn what is normal for your breasts and whether your breasts are changing is to do a breast self-exam. If you find a lump or something that was not present in the past, it is best to contact your caregiver right away. Other findings that should be evaluated by your caregiver include nipple discharge, especially if it is bloody; skin changes or reddening; areas where the skin seems to be pulled in (retracted); or new lumps and bumps. Breast pain is seldom associated with cancer (malignancy), but should also be evaluated by a caregiver. BREAST SELF-EXAM The best time to examine your breasts is 5 7 days after your menstrual period is over.  ExitCare Patient Information 2013 [employer].   Exercise to Stay Healthy Exercise helps you become and stay healthy. EXERCISE IDEAS AND TIPS Choose exercises that:  You enjoy.  Fit into your day. You do not need to exercise really hard to be healthy. You can do exercises at a slow or medium level and stay healthy. You can:  Stretch before and after working out.  Try yoga, Pilates, or tai chi.  Lift weights.  Walk fast, swim, jog, run, climb stairs, bicycle, dance, or rollerskate.  Take aerobic classes. Exercises that burn about 150 calories:  Running 1  miles in 15 minutes.  Playing volleyball for 45 to 60 minutes.  Washing and waxing a car for 45 to 60  minutes.  Playing touch football for 45 minutes.  Walking 1  miles in 35 minutes.  Pushing a stroller 1  miles in 30 minutes.  Playing basketball for 30 minutes.  Raking leaves for 30 minutes.  Bicycling 5 miles in 30 minutes.  Walking 2 miles in 30 minutes.  Dancing for 30 minutes.  Shoveling snow for 15 minutes.  Swimming laps for 20 minutes.  Walking up stairs for 15 minutes.  Bicycling 4 miles in 15 minutes.  Gardening for 30 to 45 minutes.  Jumping rope for 15 minutes.  Washing windows or floors for 45 to 60 minutes. Document Released: 10/26/2010 Document Revised: 12/16/2011 Document Reviewed: 10/26/2010 ExitCare Patient Information 2013 [employer].   Other topics ( that may be useful information):    Sexually Transmitted Disease Sexually transmitted disease (STD) refers to any infection that is passed from person to person during sexual activity. This may happen by way of saliva, semen, blood, vaginal mucus, or urine. Common STDs include:  Gonorrhea.  Chlamydia.  Syphilis.  HIV/AIDS.  Genital herpes.  Hepatitis B and C.  Trichomonas.  Human papillomavirus (HPV).  Pubic lice. CAUSES  An STD may be spread by bacteria, virus, or parasite. A person can get an STD by:  Sexual intercourse with an infected person.  Sharing sex toys with an infected person.  Sharing needles with an infected person.  Having intimate contact with the genitals, mouth, or rectal areas of an infected person. SYMPTOMS  Some people may not have any symptoms, but   they can still pass the infection to others. Different STDs have different symptoms. Symptoms include:  Painful or bloody urination.  Pain in the pelvis, abdomen, vagina, anus, throat, or eyes.  Skin rash, itching, irritation, growths, or sores (lesions). These usually occur in the genital or anal area.  Abnormal vaginal discharge.  Penile discharge in men.  Soft, flesh-colored skin growths in the  genital or anal area.  Fever.  Pain or bleeding during sexual intercourse.  Swollen glands in the groin area.  Yellow skin and eyes (jaundice). This is seen with hepatitis. DIAGNOSIS  To make a diagnosis, your caregiver may:  Take a medical history.  Perform a physical exam.  Take a specimen (culture) to be examined.  Examine a sample of discharge under a microscope.  Perform blood test TREATMENT   Chlamydia, gonorrhea, trichomonas, and syphilis can be cured with antibiotic medicine.  Genital herpes, hepatitis, and HIV can be treated, but not cured, with prescribed medicines. The medicines will lessen the symptoms.  Genital warts from HPV can be treated with medicine or by freezing, burning (electrocautery), or surgery. Warts may come back.  HPV is a virus and cannot be cured with medicine or surgery.However, abnormal areas may be followed very closely by your caregiver and may be removed from the cervix, vagina, or vulva through office procedures or surgery. If your diagnosis is confirmed, your recent sexual partners need treatment. This is true even if they are symptom-free or have a negative culture or evaluation. They should not have sex until their caregiver says it is okay. HOME CARE INSTRUCTIONS  All sexual partners should be informed, tested, and treated for all STDs.  Take your antibiotics as directed. Finish them even if you start to feel better.  Only take over-the-counter or prescription medicines for pain, discomfort, or fever as directed by your caregiver.  Rest.  Eat a balanced diet and drink enough fluids to keep your urine clear or pale yellow.  Do not have sex until treatment is completed and you have followed up with your caregiver. STDs should be checked after treatment.  Keep all follow-up appointments, Pap tests, and blood tests as directed by your caregiver.  Only use latex condoms and water-soluble lubricants during sexual activity. Do not use  petroleum jelly or oils.  Avoid alcohol and illegal drugs.  Get vaccinated for HPV and hepatitis. If you have not received these vaccines in the past, talk to your caregiver about whether one or both might be right for you.  Avoid risky sex practices that can break the skin. The only way to avoid getting an STD is to avoid all sexual activity.Latex condoms and dental dams (for oral sex) will help lessen the risk of getting an STD, but will not completely eliminate the risk. SEEK MEDICAL CARE IF:   You have a fever.  You have any new or worsening symptoms. Document Released: 12/14/2002 Document Revised: 12/16/2011 Document Reviewed: 12/21/2010 [facility] Patient Information 2013 [place].    Domestic Abuse You are being battered or abused if someone close to you hits, pushes, or physically hurts you in any way. You also are being abused if you are forced into activities. You are being sexually abused if you are forced to have sexual contact of any kind. You are being emotionally abused if you are made to feel worthless or if you are constantly threatened. It is important to remember that help is available. No one has the right to abuse you. PREVENTION OF FURTHER  ABUSE  Learn the warning signs of danger. This varies with situations but may include: the use of alcohol, threats, isolation from friends and family, or forced sexual contact. Leave if you feel that violence is going to occur.  If you are attacked or beaten, report it to the police so the abuse is documented. You do not have to press charges. The police can protect you while you or the attackers are leaving. Get the officer's name and badge number and a copy of the report.  Find someone you can trust and tell them what is happening to you: your caregiver, a nurse, clergy member, close friend or family member. Feeling ashamed is natural, but remember that you have done nothing wrong. No one deserves abuse. Document Released:  09/20/2000 Document Revised: 12/16/2011 Document Reviewed: 11/29/2010 ExitCare Patient Information 2013 [employer]Care[employer].    How Much is Too Much Alcohol? Drinking too much alcohol can cause injury, accidents, and health problems. These types of problems can include:   Car crashes.  Falls.  Family fighting (domestic violence).  Drowning.  Fights.  Injuries.  Burns.  Damage to certain organs.  Having a baby with birth defects. ONE DRINK CAN BE TOO MUCH WHEN YOU ARE:  Working.  Pregnant or breastfeeding.  Taking medicines. Ask your doctor.  Driving or planning to drive. If you or someone you know has a drinking problem, get help from a doctor.  Document Released: 07/20/2009 Document Revised: 12/16/2011 Document Reviewed: 07/20/2009 ExitCare Patient Information 2013 [employer]Care[employer].   Smoking Hazards Smoking cigarettes is extremely bad for your health. Tobacco smoke has over 200 known poisons in it. There are over 60 chemicals in tobacco smoke that cause cancer. Some of the chemicals found in cigarette smoke include:   Cyanide.  Benzene.  Formaldehyde.  Methanol (wood alcohol).  Acetylene (fuel used in welding torches).  Ammonia. Cigarette smoke also contains the poisonous gases nitrogen oxide and carbon monoxide.  Cigarette smokers have an increased risk of many serious medical problems and Smoking causes approximately:  90% of all lung cancer deaths in men.  80% of all lung cancer deaths in women.  90% of deaths from chronic obstructive lung disease. Compared with nonsmokers, smoking increases the risk of:  Coronary heart disease by 2 to 4 times.  Stroke by 2 to 4 times.  Men developing lung cancer by 23 times.  Women developing lung cancer by 13 times.  Dying from chronic obstructive lung diseases by 12 times.  . Smoking is the most preventable cause of death and disease in our society.  WHY IS SMOKING ADDICTIVE?  Nicotine is the chemical  agent in tobacco that is capable of causing addiction or dependence.  When you smoke and inhale, nicotine is absorbed rapidly into the bloodstream through your lungs. Nicotine absorbed through the lungs is capable of creating a powerful addiction. Both inhaled and non-inhaled nicotine may be addictive.  Addiction studies of cigarettes and spit tobacco show that addiction to nicotine occurs mainly during the teen years, when young people begin using tobacco products. WHAT ARE THE BENEFITS OF QUITTING?  There are many health benefits to quitting smoking.   Likelihood of developing cancer and heart disease decreases. Health improvements are seen almost immediately.  Blood pressure, pulse rate, and breathing patterns start returning to normal soon after quitting. QUITTING SMOKING   American Lung Association - 1-800-LUNGUSA  American Cancer Society - 1-800-ACS-2345 Document Released: 10/31/2004 Document Revised: 12/16/2011 Document Reviewed: 07/05/2009 ExitCare Patient Information 2013 ExitCare,   LLC.   Stress Management Stress is a state of physical or mental tension that often results from changes in your life or normal routine. Some common causes of stress are:  Death of a loved one.  Injuries or severe illnesses.  Getting fired or changing jobs.  Moving into a new home. Other causes may be:  Sexual problems.  Business or financial losses.  Taking on a large debt.  Regular conflict with someone at home or at work.  Constant tiredness from lack of sleep. It is not just bad things that are stressful. It may be stressful to:  Win the lottery.  Get married.  Buy a new car. The amount of stress that can be easily tolerated varies from person to person. Changes generally cause stress, regardless of the types of change. Too much stress can affect your health. It may lead to physical or emotional problems. Too little stress (boredom) may also become stressful. SUGGESTIONS TO  REDUCE STRESS:  Talk things over with your family and friends. It often is helpful to share your concerns and worries. If you feel your problem is serious, you may want to get help from a professional counselor.  Consider your problems one at a time instead of lumping them all together. Trying to take care of everything at once may seem impossible. List all the things you need to do and then start with the most important one. Set a goal to accomplish 2 or 3 things each day. If you expect to do too many in a single day you will naturally fail, causing you to feel even more stressed.  Do not use alcohol or drugs to relieve stress. Although you may feel better for a short time, they do not remove the problems that caused the stress. They can also be habit forming.  Exercise regularly - at least 3 times per week. Physical exercise can help to relieve that "uptight" feeling and will relax you.  The shortest distance between despair and hope is often a good night's sleep.  Go to bed and get up on time allowing yourself time for appointments without being rushed.  Take a short "time-out" period from any stressful situation that occurs during the day. Close your eyes and take some deep breaths. Starting with the muscles in your face, tense them, hold it for a few seconds, then relax. Repeat this with the muscles in your neck, shoulders, hand, stomach, back and legs.  Take good care of yourself. Eat a balanced diet and get plenty of rest.  Schedule time for having fun. Take a break from your daily routine to relax. HOME CARE INSTRUCTIONS   Call if you feel overwhelmed by your problems and feel you can no longer manage them on your own.  Return immediately if you feel like hurting yourself or someone else. Document Released: 03/19/2001 Document Revised: 12/16/2011 Document Reviewed: 11/09/2007 [facility] Patient Information 2013 [employer].   Oral Contraception Use Oral contraceptive pills  (OCPs) are medicines taken to prevent pregnancy. OCPs work by preventing the ovaries from releasing eggs. The hormones in OCPs also cause the cervical mucus to thicken, preventing the sperm from entering the uterus. The hormones also cause the uterine lining to become thin, not allowing a fertilized egg to attach to the inside of the uterus. OCPs are highly effective when taken exactly as prescribed. However, OCPs do not prevent sexually transmitted diseases (STDs). Safe sex practices, such as using condoms along with an OCP, can help prevent STDs.  Before taking OCPs, you may have a physical exam and Pap test. Your health care provider may also order blood tests if necessary. Your health care provider will make sure you are a good candidate for oral contraception. Discuss with your health care provider the possible side effects of the OCP you may be prescribed. When starting an OCP, it can take 2 to 3 months for the body to adjust to the changes in hormone levels in your body. How to take oral contraceptive pills Your health care provider may advise you on how to start taking the first cycle of OCPs. Otherwise, you can:  Start on day 1 of your menstrual period. You will not need any backup contraceptive protection with this start time.  Start on the first Sunday after your menstrual period or the day you get your prescription. In these cases, you will need to use backup contraceptive protection for the first week.  Start the pill at any time of your cycle. If you take the pill within 5 days of the start of your period, you are protected against pregnancy right away. In this case, you will not need a backup form of birth control. If you start at any other time of your menstrual cycle, you will need to use another form of birth control for 7 days. If your OCP is the type called a minipill, it will protect you from pregnancy after taking it for 2 days (48 hours).  After you have started taking OCPs:  If  you forget to take 1 pill, take it as soon as you remember. Take the next pill at the regular time.  If you miss 2 or more pills, call your health care provider because different pills have different instructions for missed doses. Use backup birth control until your next menstrual period starts.  If you use a 28-day pack that contains inactive pills and you miss 1 of the last 7 pills (pills with no hormones), it will not matter. Throw away the rest of the non-hormone pills and start a new pill pack.  No matter which day you start the OCP, you will always start a new pack on that same day of the week. Have an extra pack of OCPs and a backup contraceptive method available in case you miss some pills or lose your OCP pack. Follow these instructions at home:  Do not smoke.  Always use a condom to protect against STDs. OCPs do not protect against STDs.  Use a calendar to mark your menstrual period days.  Read the information and directions that came with your OCP. Talk to your health care provider if you have questions. Contact a health care provider if:  You develop nausea and vomiting.  You have abnormal vaginal discharge or bleeding.  You develop a rash.  You miss your menstrual period.  You are losing your hair.  You need treatment for mood swings or depression.  You get dizzy when taking the OCP.  You develop acne from taking the OCP.  You become pregnant. Get help right away if:  You develop chest pain.  You develop shortness of breath.  You have an uncontrolled or severe headache.  You develop numbness or slurred speech.  You develop visual problems.  You develop pain, redness, and swelling in the legs. This information is not intended to replace advice given to you by your health care provider. Make sure you discuss any questions you have with your health care provider. Document Released: 09/12/2011  Document Revised: 02/29/2016 Document Reviewed:  03/14/2013 Elsevier Interactive Patient Education  2017 [employer]vier Inc.  

## 2017-07-30 DIAGNOSIS — J3089 Other allergic rhinitis: Secondary | ICD-10-CM | POA: Diagnosis not present

## 2017-07-30 DIAGNOSIS — J3081 Allergic rhinitis due to animal (cat) (dog) hair and dander: Secondary | ICD-10-CM | POA: Diagnosis not present

## 2017-07-30 DIAGNOSIS — J301 Allergic rhinitis due to pollen: Secondary | ICD-10-CM | POA: Diagnosis not present

## 2017-08-06 DIAGNOSIS — J301 Allergic rhinitis due to pollen: Secondary | ICD-10-CM | POA: Diagnosis not present

## 2017-08-06 DIAGNOSIS — J3081 Allergic rhinitis due to animal (cat) (dog) hair and dander: Secondary | ICD-10-CM | POA: Diagnosis not present

## 2017-08-06 DIAGNOSIS — J3089 Other allergic rhinitis: Secondary | ICD-10-CM | POA: Diagnosis not present

## 2017-08-13 DIAGNOSIS — J301 Allergic rhinitis due to pollen: Secondary | ICD-10-CM | POA: Diagnosis not present

## 2017-08-13 DIAGNOSIS — J3081 Allergic rhinitis due to animal (cat) (dog) hair and dander: Secondary | ICD-10-CM | POA: Diagnosis not present

## 2017-08-13 DIAGNOSIS — J3089 Other allergic rhinitis: Secondary | ICD-10-CM | POA: Diagnosis not present

## 2017-08-22 DIAGNOSIS — J3081 Allergic rhinitis due to animal (cat) (dog) hair and dander: Secondary | ICD-10-CM | POA: Diagnosis not present

## 2017-08-22 DIAGNOSIS — J3089 Other allergic rhinitis: Secondary | ICD-10-CM | POA: Diagnosis not present

## 2017-08-22 DIAGNOSIS — J301 Allergic rhinitis due to pollen: Secondary | ICD-10-CM | POA: Diagnosis not present

## 2017-08-27 DIAGNOSIS — J3089 Other allergic rhinitis: Secondary | ICD-10-CM | POA: Diagnosis not present

## 2017-08-27 DIAGNOSIS — J3081 Allergic rhinitis due to animal (cat) (dog) hair and dander: Secondary | ICD-10-CM | POA: Diagnosis not present

## 2017-08-27 DIAGNOSIS — J301 Allergic rhinitis due to pollen: Secondary | ICD-10-CM | POA: Diagnosis not present

## 2017-09-03 DIAGNOSIS — J3089 Other allergic rhinitis: Secondary | ICD-10-CM | POA: Diagnosis not present

## 2017-09-03 DIAGNOSIS — J3081 Allergic rhinitis due to animal (cat) (dog) hair and dander: Secondary | ICD-10-CM | POA: Diagnosis not present

## 2017-09-03 DIAGNOSIS — J301 Allergic rhinitis due to pollen: Secondary | ICD-10-CM | POA: Diagnosis not present

## 2017-10-02 DIAGNOSIS — J301 Allergic rhinitis due to pollen: Secondary | ICD-10-CM | POA: Diagnosis not present

## 2017-10-02 DIAGNOSIS — R05 Cough: Secondary | ICD-10-CM | POA: Diagnosis not present

## 2017-10-02 DIAGNOSIS — J3089 Other allergic rhinitis: Secondary | ICD-10-CM | POA: Diagnosis not present

## 2017-10-02 DIAGNOSIS — J3081 Allergic rhinitis due to animal (cat) (dog) hair and dander: Secondary | ICD-10-CM | POA: Diagnosis not present

## 2017-10-08 DIAGNOSIS — J301 Allergic rhinitis due to pollen: Secondary | ICD-10-CM | POA: Diagnosis not present

## 2017-10-08 DIAGNOSIS — J3081 Allergic rhinitis due to animal (cat) (dog) hair and dander: Secondary | ICD-10-CM | POA: Diagnosis not present

## 2017-10-15 DIAGNOSIS — J301 Allergic rhinitis due to pollen: Secondary | ICD-10-CM | POA: Diagnosis not present

## 2017-10-15 DIAGNOSIS — J3089 Other allergic rhinitis: Secondary | ICD-10-CM | POA: Diagnosis not present

## 2017-10-15 DIAGNOSIS — J3081 Allergic rhinitis due to animal (cat) (dog) hair and dander: Secondary | ICD-10-CM | POA: Diagnosis not present

## 2017-10-20 DIAGNOSIS — J3081 Allergic rhinitis due to animal (cat) (dog) hair and dander: Secondary | ICD-10-CM | POA: Diagnosis not present

## 2017-10-20 DIAGNOSIS — J301 Allergic rhinitis due to pollen: Secondary | ICD-10-CM | POA: Diagnosis not present

## 2017-10-20 DIAGNOSIS — Z136 Encounter for screening for cardiovascular disorders: Secondary | ICD-10-CM | POA: Diagnosis not present

## 2017-10-20 DIAGNOSIS — Z0000 Encounter for general adult medical examination without abnormal findings: Secondary | ICD-10-CM | POA: Diagnosis not present

## 2017-10-20 DIAGNOSIS — J3089 Other allergic rhinitis: Secondary | ICD-10-CM | POA: Diagnosis not present

## 2017-10-20 DIAGNOSIS — Z131 Encounter for screening for diabetes mellitus: Secondary | ICD-10-CM | POA: Diagnosis not present

## 2017-10-27 DIAGNOSIS — J3089 Other allergic rhinitis: Secondary | ICD-10-CM | POA: Diagnosis not present

## 2017-10-27 DIAGNOSIS — J301 Allergic rhinitis due to pollen: Secondary | ICD-10-CM | POA: Diagnosis not present

## 2017-10-27 DIAGNOSIS — J3081 Allergic rhinitis due to animal (cat) (dog) hair and dander: Secondary | ICD-10-CM | POA: Diagnosis not present

## 2017-10-31 DIAGNOSIS — M542 Cervicalgia: Secondary | ICD-10-CM | POA: Diagnosis not present

## 2017-10-31 DIAGNOSIS — M25511 Pain in right shoulder: Secondary | ICD-10-CM | POA: Diagnosis not present

## 2017-10-31 DIAGNOSIS — M5382 Other specified dorsopathies, cervical region: Secondary | ICD-10-CM | POA: Diagnosis not present

## 2017-10-31 DIAGNOSIS — R200 Anesthesia of skin: Secondary | ICD-10-CM | POA: Diagnosis not present

## 2017-11-05 DIAGNOSIS — J3089 Other allergic rhinitis: Secondary | ICD-10-CM | POA: Diagnosis not present

## 2017-11-05 DIAGNOSIS — J301 Allergic rhinitis due to pollen: Secondary | ICD-10-CM | POA: Diagnosis not present

## 2017-11-05 DIAGNOSIS — J3081 Allergic rhinitis due to animal (cat) (dog) hair and dander: Secondary | ICD-10-CM | POA: Diagnosis not present

## 2017-11-13 DIAGNOSIS — M542 Cervicalgia: Secondary | ICD-10-CM | POA: Diagnosis not present

## 2017-11-13 DIAGNOSIS — R202 Paresthesia of skin: Secondary | ICD-10-CM | POA: Diagnosis not present

## 2017-11-13 DIAGNOSIS — R200 Anesthesia of skin: Secondary | ICD-10-CM | POA: Diagnosis not present

## 2017-11-13 DIAGNOSIS — M5382 Other specified dorsopathies, cervical region: Secondary | ICD-10-CM | POA: Diagnosis not present

## 2017-11-19 DIAGNOSIS — J3081 Allergic rhinitis due to animal (cat) (dog) hair and dander: Secondary | ICD-10-CM | POA: Diagnosis not present

## 2017-11-19 DIAGNOSIS — J3089 Other allergic rhinitis: Secondary | ICD-10-CM | POA: Diagnosis not present

## 2017-11-19 DIAGNOSIS — J301 Allergic rhinitis due to pollen: Secondary | ICD-10-CM | POA: Diagnosis not present

## 2017-11-24 DIAGNOSIS — R200 Anesthesia of skin: Secondary | ICD-10-CM | POA: Diagnosis not present

## 2017-11-24 DIAGNOSIS — R202 Paresthesia of skin: Secondary | ICD-10-CM | POA: Diagnosis not present

## 2017-11-24 DIAGNOSIS — G8929 Other chronic pain: Secondary | ICD-10-CM | POA: Diagnosis not present

## 2017-11-24 DIAGNOSIS — M5382 Other specified dorsopathies, cervical region: Secondary | ICD-10-CM | POA: Diagnosis not present

## 2017-11-24 DIAGNOSIS — M542 Cervicalgia: Secondary | ICD-10-CM | POA: Diagnosis not present

## 2017-12-02 DIAGNOSIS — M542 Cervicalgia: Secondary | ICD-10-CM | POA: Diagnosis not present

## 2017-12-03 DIAGNOSIS — J3081 Allergic rhinitis due to animal (cat) (dog) hair and dander: Secondary | ICD-10-CM | POA: Diagnosis not present

## 2017-12-03 DIAGNOSIS — J3089 Other allergic rhinitis: Secondary | ICD-10-CM | POA: Diagnosis not present

## 2017-12-03 DIAGNOSIS — J301 Allergic rhinitis due to pollen: Secondary | ICD-10-CM | POA: Diagnosis not present

## 2017-12-17 DIAGNOSIS — J3081 Allergic rhinitis due to animal (cat) (dog) hair and dander: Secondary | ICD-10-CM | POA: Diagnosis not present

## 2017-12-17 DIAGNOSIS — J301 Allergic rhinitis due to pollen: Secondary | ICD-10-CM | POA: Diagnosis not present

## 2017-12-17 DIAGNOSIS — J3089 Other allergic rhinitis: Secondary | ICD-10-CM | POA: Diagnosis not present

## 2017-12-23 DIAGNOSIS — M542 Cervicalgia: Secondary | ICD-10-CM | POA: Diagnosis not present

## 2017-12-23 DIAGNOSIS — M5382 Other specified dorsopathies, cervical region: Secondary | ICD-10-CM | POA: Diagnosis not present

## 2017-12-23 DIAGNOSIS — R202 Paresthesia of skin: Secondary | ICD-10-CM | POA: Diagnosis not present

## 2017-12-23 DIAGNOSIS — R200 Anesthesia of skin: Secondary | ICD-10-CM | POA: Diagnosis not present

## 2017-12-29 DIAGNOSIS — R200 Anesthesia of skin: Secondary | ICD-10-CM | POA: Diagnosis not present

## 2017-12-29 DIAGNOSIS — M5382 Other specified dorsopathies, cervical region: Secondary | ICD-10-CM | POA: Diagnosis not present

## 2017-12-29 DIAGNOSIS — M542 Cervicalgia: Secondary | ICD-10-CM | POA: Diagnosis not present

## 2017-12-29 DIAGNOSIS — R202 Paresthesia of skin: Secondary | ICD-10-CM | POA: Diagnosis not present

## 2017-12-31 DIAGNOSIS — J3089 Other allergic rhinitis: Secondary | ICD-10-CM | POA: Diagnosis not present

## 2017-12-31 DIAGNOSIS — J3081 Allergic rhinitis due to animal (cat) (dog) hair and dander: Secondary | ICD-10-CM | POA: Diagnosis not present

## 2017-12-31 DIAGNOSIS — J301 Allergic rhinitis due to pollen: Secondary | ICD-10-CM | POA: Diagnosis not present

## 2018-01-02 DIAGNOSIS — R202 Paresthesia of skin: Secondary | ICD-10-CM | POA: Diagnosis not present

## 2018-01-02 DIAGNOSIS — M542 Cervicalgia: Secondary | ICD-10-CM | POA: Diagnosis not present

## 2018-01-02 DIAGNOSIS — M5382 Other specified dorsopathies, cervical region: Secondary | ICD-10-CM | POA: Diagnosis not present

## 2018-01-02 DIAGNOSIS — R200 Anesthesia of skin: Secondary | ICD-10-CM | POA: Diagnosis not present

## 2018-01-14 DIAGNOSIS — J3089 Other allergic rhinitis: Secondary | ICD-10-CM | POA: Diagnosis not present

## 2018-01-14 DIAGNOSIS — J301 Allergic rhinitis due to pollen: Secondary | ICD-10-CM | POA: Diagnosis not present

## 2018-01-14 DIAGNOSIS — J3081 Allergic rhinitis due to animal (cat) (dog) hair and dander: Secondary | ICD-10-CM | POA: Diagnosis not present

## 2018-01-15 DIAGNOSIS — J3089 Other allergic rhinitis: Secondary | ICD-10-CM | POA: Diagnosis not present

## 2018-01-15 DIAGNOSIS — J3081 Allergic rhinitis due to animal (cat) (dog) hair and dander: Secondary | ICD-10-CM | POA: Diagnosis not present

## 2018-01-28 DIAGNOSIS — J3089 Other allergic rhinitis: Secondary | ICD-10-CM | POA: Diagnosis not present

## 2018-01-28 DIAGNOSIS — J3081 Allergic rhinitis due to animal (cat) (dog) hair and dander: Secondary | ICD-10-CM | POA: Diagnosis not present

## 2018-01-28 DIAGNOSIS — J301 Allergic rhinitis due to pollen: Secondary | ICD-10-CM | POA: Diagnosis not present

## 2018-02-03 DIAGNOSIS — J3089 Other allergic rhinitis: Secondary | ICD-10-CM | POA: Diagnosis not present

## 2018-02-03 DIAGNOSIS — J3081 Allergic rhinitis due to animal (cat) (dog) hair and dander: Secondary | ICD-10-CM | POA: Diagnosis not present

## 2018-02-03 DIAGNOSIS — J301 Allergic rhinitis due to pollen: Secondary | ICD-10-CM | POA: Diagnosis not present

## 2018-02-11 DIAGNOSIS — J301 Allergic rhinitis due to pollen: Secondary | ICD-10-CM | POA: Diagnosis not present

## 2018-02-11 DIAGNOSIS — J3089 Other allergic rhinitis: Secondary | ICD-10-CM | POA: Diagnosis not present

## 2018-02-11 DIAGNOSIS — J3081 Allergic rhinitis due to animal (cat) (dog) hair and dander: Secondary | ICD-10-CM | POA: Diagnosis not present

## 2018-02-13 DIAGNOSIS — J301 Allergic rhinitis due to pollen: Secondary | ICD-10-CM | POA: Diagnosis not present

## 2018-02-13 DIAGNOSIS — J3081 Allergic rhinitis due to animal (cat) (dog) hair and dander: Secondary | ICD-10-CM | POA: Diagnosis not present

## 2018-02-13 DIAGNOSIS — J3089 Other allergic rhinitis: Secondary | ICD-10-CM | POA: Diagnosis not present

## 2018-02-17 DIAGNOSIS — J3081 Allergic rhinitis due to animal (cat) (dog) hair and dander: Secondary | ICD-10-CM | POA: Diagnosis not present

## 2018-02-17 DIAGNOSIS — J301 Allergic rhinitis due to pollen: Secondary | ICD-10-CM | POA: Diagnosis not present

## 2018-02-17 DIAGNOSIS — J3089 Other allergic rhinitis: Secondary | ICD-10-CM | POA: Diagnosis not present

## 2018-02-20 DIAGNOSIS — B078 Other viral warts: Secondary | ICD-10-CM | POA: Diagnosis not present

## 2018-02-20 DIAGNOSIS — B360 Pityriasis versicolor: Secondary | ICD-10-CM | POA: Diagnosis not present

## 2018-02-20 DIAGNOSIS — J3081 Allergic rhinitis due to animal (cat) (dog) hair and dander: Secondary | ICD-10-CM | POA: Diagnosis not present

## 2018-02-20 DIAGNOSIS — J301 Allergic rhinitis due to pollen: Secondary | ICD-10-CM | POA: Diagnosis not present

## 2018-02-20 DIAGNOSIS — J3089 Other allergic rhinitis: Secondary | ICD-10-CM | POA: Diagnosis not present

## 2018-03-04 DIAGNOSIS — J3089 Other allergic rhinitis: Secondary | ICD-10-CM | POA: Diagnosis not present

## 2018-03-04 DIAGNOSIS — J3081 Allergic rhinitis due to animal (cat) (dog) hair and dander: Secondary | ICD-10-CM | POA: Diagnosis not present

## 2018-03-04 DIAGNOSIS — J301 Allergic rhinitis due to pollen: Secondary | ICD-10-CM | POA: Diagnosis not present

## 2018-03-11 DIAGNOSIS — J3081 Allergic rhinitis due to animal (cat) (dog) hair and dander: Secondary | ICD-10-CM | POA: Diagnosis not present

## 2018-03-11 DIAGNOSIS — J301 Allergic rhinitis due to pollen: Secondary | ICD-10-CM | POA: Diagnosis not present

## 2018-03-11 DIAGNOSIS — J3089 Other allergic rhinitis: Secondary | ICD-10-CM | POA: Diagnosis not present

## 2018-03-18 DIAGNOSIS — R05 Cough: Secondary | ICD-10-CM | POA: Diagnosis not present

## 2018-03-18 DIAGNOSIS — R030 Elevated blood-pressure reading, without diagnosis of hypertension: Secondary | ICD-10-CM | POA: Diagnosis not present

## 2018-03-18 DIAGNOSIS — J0100 Acute maxillary sinusitis, unspecified: Secondary | ICD-10-CM | POA: Diagnosis not present

## 2018-03-27 DIAGNOSIS — J3089 Other allergic rhinitis: Secondary | ICD-10-CM | POA: Diagnosis not present

## 2018-03-27 DIAGNOSIS — J301 Allergic rhinitis due to pollen: Secondary | ICD-10-CM | POA: Diagnosis not present

## 2018-04-01 DIAGNOSIS — J3081 Allergic rhinitis due to animal (cat) (dog) hair and dander: Secondary | ICD-10-CM | POA: Diagnosis not present

## 2018-04-01 DIAGNOSIS — J301 Allergic rhinitis due to pollen: Secondary | ICD-10-CM | POA: Diagnosis not present

## 2018-04-01 DIAGNOSIS — J3089 Other allergic rhinitis: Secondary | ICD-10-CM | POA: Diagnosis not present

## 2018-04-15 DIAGNOSIS — J301 Allergic rhinitis due to pollen: Secondary | ICD-10-CM | POA: Diagnosis not present

## 2018-04-15 DIAGNOSIS — J3081 Allergic rhinitis due to animal (cat) (dog) hair and dander: Secondary | ICD-10-CM | POA: Diagnosis not present

## 2018-04-15 DIAGNOSIS — J3089 Other allergic rhinitis: Secondary | ICD-10-CM | POA: Diagnosis not present

## 2018-04-22 DIAGNOSIS — J3081 Allergic rhinitis due to animal (cat) (dog) hair and dander: Secondary | ICD-10-CM | POA: Diagnosis not present

## 2018-04-22 DIAGNOSIS — J301 Allergic rhinitis due to pollen: Secondary | ICD-10-CM | POA: Diagnosis not present

## 2018-04-22 DIAGNOSIS — J3089 Other allergic rhinitis: Secondary | ICD-10-CM | POA: Diagnosis not present

## 2018-04-23 ENCOUNTER — Telehealth: Payer: Self-pay | Admitting: Certified Nurse Midwife

## 2018-04-23 DIAGNOSIS — N930 Postcoital and contact bleeding: Secondary | ICD-10-CM

## 2018-04-23 NOTE — Telephone Encounter (Signed)
Order placed for PUS. Encounter closed.   Routing to [employer] Rosa Davis for [employer].

## 2018-04-23 NOTE — Telephone Encounter (Signed)
Patient is bleeding during and after intercourse.

## 2018-04-23 NOTE — Telephone Encounter (Signed)
Spoke with patient. Reports post coital bleeding has increased. Happens almost 75 % of the time, resolves 12 hrs after. Denies pain with intercourse or any other GYN symptoms. On Seasonale OCP for contraceptive, LMP 04/09/18.   Last AEX 07/25/17 with Brandi Bradshaw, CNM. Has seen Brandi Bradshaw in the past for postcoital bleeding, requesting OV with Brandi Bradshaw.   PUS scheduled for 04/30/18 at 2:30pm, consult to follow at 3pm with Brandi Bradshaw. Advised will review with Brandi Bradshaw and return call with any additional recommendations. Instructed to return call to office if symptoms worsen or new symptoms develop.   Brandi Bradshaw -please review, ok to proceed with PUS as scheduled?   Cc: Brandi Bradshaw, CNM

## 2018-04-23 NOTE — Telephone Encounter (Signed)
Yes.  Ok to proceed with ultrasound.  Thanks.

## 2018-04-30 ENCOUNTER — Other Ambulatory Visit (HOSPITAL_COMMUNITY)
Admission: RE | Admit: 2018-04-30 | Discharge: 2018-04-30 | Disposition: A | Discharge status: Home / Self Care | Payer: BLUE CROSS/BLUE SHIELD | Source: Ambulatory Visit | Attending: Obstetrics & Gynecology | Admitting: Obstetrics & Gynecology

## 2018-04-30 ENCOUNTER — Ambulatory Visit (INDEPENDENT_AMBULATORY_CARE_PROVIDER_SITE_OTHER): Payer: BLUE CROSS/BLUE SHIELD

## 2018-04-30 ENCOUNTER — Ambulatory Visit: Payer: BLUE CROSS/BLUE SHIELD | Admitting: Obstetrics & Gynecology

## 2018-04-30 VITALS — BP 122/80 | HR 84 | Resp 16 | Ht 67.0 in | Wt 174.0 lb

## 2018-04-30 DIAGNOSIS — N893 Dysplasia of vagina, unspecified: Secondary | ICD-10-CM | POA: Diagnosis not present

## 2018-04-30 DIAGNOSIS — J3081 Allergic rhinitis due to animal (cat) (dog) hair and dander: Secondary | ICD-10-CM | POA: Diagnosis not present

## 2018-04-30 DIAGNOSIS — N926 Irregular menstruation, unspecified: Secondary | ICD-10-CM | POA: Insufficient documentation

## 2018-04-30 DIAGNOSIS — N930 Postcoital and contact bleeding: Secondary | ICD-10-CM

## 2018-04-30 DIAGNOSIS — N888 Other specified noninflammatory disorders of cervix uteri: Secondary | ICD-10-CM | POA: Diagnosis not present

## 2018-04-30 DIAGNOSIS — J3089 Other allergic rhinitis: Secondary | ICD-10-CM | POA: Diagnosis not present

## 2018-04-30 DIAGNOSIS — Z124 Encounter for screening for malignant neoplasm of cervix: Secondary | ICD-10-CM

## 2018-04-30 DIAGNOSIS — J301 Allergic rhinitis due to pollen: Secondary | ICD-10-CM | POA: Diagnosis not present

## 2018-04-30 MED ORDER — AZITHROMYCIN 500 MG PO TABS: ORAL_TABLET | ORAL | 0 refills | Status: DC

## 2018-04-30 NOTE — Progress Notes (Signed)
22 y.o. G0P0000 Single Caucasian female here for pelvic ultrasound due to recurrent bleeding with intercourse.  Denies pelvic pain.  Bleeding has been ongoing for several years.  I saw her for this in the past and felt her very ectocervical transition zone was the cause.  She has been with her current partner for several years.  They are very committed.  Has complaint of greenish sinus drainage, pressure, forehead pressure, pain.  Would like treatment.  Denies fever.  Patient's last menstrual period was 04/09/2018.  Contraception: OCPs  Findings:  UTERUS: 7.3 x 2.7 x 3.6cm EMS:4.443mm ADNEXA: Left ovary: 2.8 x 1.4 x 1.1cm       Right ovary: 3.2 x 1.5 x 0.7cm CUL DE SAC: no free fluid  Physical Exam  Constitutional: She appears well-developed and well-nourished.  GI: Soft. Bowel sounds are normal.  Genitourinary: Vagina normal and uterus normal. There is no rash, tenderness, lesion or injury on the right labia. There is no rash, tenderness, lesion or injury on the left labia. Cervix exhibits friability (very ectocervical transition zone with significant glandular tissue present that bleeds easily with touch of q tip).  Lymphadenopathy:       Right: No inguinal adenopathy present.       Left: No inguinal adenopathy present.   Pap and vaginitis testing obtained.  Discussion:  D/w pt findings on exam that are consistent with exam several years ago.  Pap and Vaginitis testing obtained, just for completeness.  At this time, I don't have a lot of suggestions for the pt except for cryo to the cervix but she has not had any pregnancies and does not want to do anything that could affect getting pregnant or the actual pregnancy.  Assessment:  Cervical bleeding Sinusitis  Plan:  I am going to need to do some research for this pt and communicate back with her.  Affirm and pap smear obtained today Z pak to pharmacy.  ~30 minutes spent with patient >50% of time was in face to face discussion of  above.

## 2018-05-01 LAB — VAGINITIS/VAGINOSIS, DNA PROBE
CANDIDA SPECIES: NEGATIVE
Gardnerella vaginalis: NEGATIVE
Trichomonas vaginosis: NEGATIVE

## 2018-05-05 LAB — CYTOLOGY - PAP: DIAGNOSIS: NEGATIVE

## 2018-05-06 ENCOUNTER — Encounter: Payer: Self-pay | Admitting: Obstetrics & Gynecology

## 2018-05-06 DIAGNOSIS — N888 Other specified noninflammatory disorders of cervix uteri: Secondary | ICD-10-CM | POA: Insufficient documentation

## 2018-05-12 ENCOUNTER — Other Ambulatory Visit: Payer: Self-pay | Admitting: *Deleted

## 2018-05-12 MED ORDER — ESTRADIOL 0.1 MG/GM VA CREA: 1.0000 | TOPICAL_CREAM | VAGINAL | 1 refills | Status: DC

## 2018-05-13 DIAGNOSIS — J3081 Allergic rhinitis due to animal (cat) (dog) hair and dander: Secondary | ICD-10-CM | POA: Diagnosis not present

## 2018-05-13 DIAGNOSIS — J3089 Other allergic rhinitis: Secondary | ICD-10-CM | POA: Diagnosis not present

## 2018-05-13 DIAGNOSIS — J301 Allergic rhinitis due to pollen: Secondary | ICD-10-CM | POA: Diagnosis not present

## 2018-05-27 DIAGNOSIS — J301 Allergic rhinitis due to pollen: Secondary | ICD-10-CM | POA: Diagnosis not present

## 2018-05-27 DIAGNOSIS — J3081 Allergic rhinitis due to animal (cat) (dog) hair and dander: Secondary | ICD-10-CM | POA: Diagnosis not present

## 2018-05-27 DIAGNOSIS — J3089 Other allergic rhinitis: Secondary | ICD-10-CM | POA: Diagnosis not present

## 2018-06-05 DIAGNOSIS — J3089 Other allergic rhinitis: Secondary | ICD-10-CM | POA: Diagnosis not present

## 2018-06-05 DIAGNOSIS — J3081 Allergic rhinitis due to animal (cat) (dog) hair and dander: Secondary | ICD-10-CM | POA: Diagnosis not present

## 2018-06-05 DIAGNOSIS — J301 Allergic rhinitis due to pollen: Secondary | ICD-10-CM | POA: Diagnosis not present

## 2018-06-17 DIAGNOSIS — J3089 Other allergic rhinitis: Secondary | ICD-10-CM | POA: Diagnosis not present

## 2018-06-17 DIAGNOSIS — J3081 Allergic rhinitis due to animal (cat) (dog) hair and dander: Secondary | ICD-10-CM | POA: Diagnosis not present

## 2018-06-17 DIAGNOSIS — J301 Allergic rhinitis due to pollen: Secondary | ICD-10-CM | POA: Diagnosis not present

## 2018-06-30 ENCOUNTER — Encounter: Payer: Self-pay | Admitting: Obstetrics & Gynecology

## 2018-06-30 ENCOUNTER — Ambulatory Visit: Payer: BLUE CROSS/BLUE SHIELD | Admitting: Obstetrics & Gynecology

## 2018-06-30 VITALS — BP 116/70 | HR 80 | Resp 16 | Ht 67.0 in | Wt 172.0 lb

## 2018-06-30 DIAGNOSIS — J3081 Allergic rhinitis due to animal (cat) (dog) hair and dander: Secondary | ICD-10-CM | POA: Diagnosis not present

## 2018-06-30 DIAGNOSIS — Z3041 Encounter for surveillance of contraceptive pills: Secondary | ICD-10-CM | POA: Diagnosis not present

## 2018-06-30 DIAGNOSIS — N888 Other specified noninflammatory disorders of cervix uteri: Secondary | ICD-10-CM

## 2018-06-30 DIAGNOSIS — J301 Allergic rhinitis due to pollen: Secondary | ICD-10-CM | POA: Diagnosis not present

## 2018-06-30 DIAGNOSIS — J3089 Other allergic rhinitis: Secondary | ICD-10-CM | POA: Diagnosis not present

## 2018-06-30 MED ORDER — LEVONORGEST-ETH ESTRAD 91-DAY 0.15-0.03 MG PO TABS: 1.0000 | ORAL_TABLET | Freq: Every day | ORAL | 1 refills | Status: DC

## 2018-06-30 MED ORDER — ESTRADIOL 0.1 MG/GM VA CREA: TOPICAL_CREAM | VAGINAL | 5 refills | Status: DC

## 2018-06-30 NOTE — Progress Notes (Signed)
GYNECOLOGY  VISIT  CC:   Cervical bleeding   HPI: 22 y.o. G0P0000 Single White or Caucasian female here for recheck of cervix after being treated with estrace vag cream.  Pt reports that the vaginal estrogen has made a huge difference in regards to bleeding.  Has not had bleeding with intercourse in several weeks.  Had not had any irregular spotting.  Really happy with this.  Denies breast tenderness.  Is using vaginal estrogen cream twice weekly.  Has been using full applicator which is causing a lot of messiness.  Otherwise, she is very please.  Pap was negative and vaginitis testing was negative as well.  Has AEX scheduled in a couple of weeks.  I feel this appt can be extended due to recent pap smear.    Does have some questions about contraception options.  Feels she has gained a little weight.  Is really happy with improvement in skin and acne.  Has considered an IUD.  I do have some concerns about possibly increasing bleeding risks.  Also, it is likely that acne will worsen.  After discussion of options, she has decided to stay with OCPs.  However, lower dosage was discussed.  She is going to consider and let me know.  GYNECOLOGIC HISTORY: No LMP recorded. (Menstrual status: Oral contraceptives). Contraception: OCP Menopausal hormone therapy: none  Patient Active Problem List   Diagnosis Date Noted  . Bleeding of cervix 05/06/2018    Past Medical History:  Diagnosis Date  . ADHD (attention deficit hyperactivity disorder)     Past Surgical History:  Procedure Laterality Date  . WISDOM TOOTH EXTRACTION  2009    MEDS:   Current Outpatient Medications on File Prior to Visit  Medication Sig Dispense Refill  . busPIRone (BUSPAR) 10 MG tablet TAKE 1 TABLET BY MOUTH UP TO 2 TIMES A DAY AS NEEDED FOR ANXIETY  5  . clindamycin (CLEOCIN T) 1 % external solution as needed.  2  . dexmethylphenidate (FOCALIN) 5 MG tablet Take 5 mg by mouth as needed.    . DRYSOL 20 % external solution as  needed.  2  . estradiol (ESTRACE) 0.1 MG/GM vaginal cream Place 1 Applicatorful vaginally 2 (two) times a week. 42.5 g 1  . Fexofenadine-Pseudoephedrine (ALLEGRA-D PO) Take by mouth as needed.    Marland Kitchen. levocetirizine (XYZAL) 5 MG tablet Take 5 mg by mouth every evening.    Marland Kitchen. levonorgestrel-ethinyl estradiol (SEASONALE,INTROVALE,JOLESSA) 0.15-0.03 MG tablet Take 1 tablet by mouth daily. 91 tablet 4  . lisdexamfetamine (VYVANSE) 30 MG capsule Take 1 capsule by mouth daily.    . montelukast (SINGULAIR) 10 MG tablet Take 10 mg by mouth at bedtime.     No current facility-administered medications on file prior to visit.     ALLERGIES: Patient has no known allergies.  Family History  Problem Relation Age of Onset  . Diabetes Mother     SH:  Single, non smoker  Review of Systems  All other systems reviewed and are negative.   PHYSICAL EXAMINATION:    BP 116/70 (BP Location: Right Arm, Patient Position: Sitting, Cuff Size: Large)   Pulse 80   Resp 16   Ht 5\' 7"  (1.702 m)   Wt 172 lb (78 kg)   BMI 26.94 kg/m     General appearance: alert, cooperative and appears stated age Lymph:  no inguinal LAD noted  Pelvic: External genitalia:  no lesions  Urethra:  normal appearing urethra with no masses, tenderness or lesions              Bartholins and Skenes: normal                 Vagina: normal appearing vagina with normal color and discharge, no lesions              Cervix: no lesions and no bleeding present with aggressive palpation with scopette             Assessment: Friable cervix with irregular bleeding, much improvement with vaginal estrogen Concerns about OCP side effects including weight gain  Plan: She is going to continue monitoring bleeding and let me know if returns Rx for Seasonal #13mo supply/4RF Continue estrace vaginal cream with 1 gram pv twice weekly.  She may be able to titrate dosing down. Information provided.  Questions first.     ~15 minutes spent  with patient >50% of time was in face to face discussion of above.

## 2018-07-06 ENCOUNTER — Telehealth: Payer: Self-pay | Admitting: Obstetrics & Gynecology

## 2018-07-06 NOTE — Telephone Encounter (Signed)
Patient calling to check status of birth control refill. [facility] did not receive request.

## 2018-07-06 NOTE — Telephone Encounter (Signed)
Call to patient. Advised patient RN had spoken with [facility] and they confirmed they had received RX and would begin filling it. Patient verbalized understanding and appreciative of phone call.   Routing to provider and will close encounter.

## 2018-07-06 NOTE — Telephone Encounter (Signed)
Spoke with Pharmacist at [facility] advised prescription had been received and would fill. RN advised would update patient.

## 2018-07-06 NOTE — Telephone Encounter (Signed)
Call to patient. RN advised that Dr. Hyacinth Meeker sent 91 tablets, 1 RF on 06-30-18 to confirmed pharmacy on file. Patient states she went to [facility] to pick up OCP and they told her that they never received the prescription. RN advised would contact pharmacy.

## 2018-07-17 DIAGNOSIS — J301 Allergic rhinitis due to pollen: Secondary | ICD-10-CM | POA: Diagnosis not present

## 2018-07-17 DIAGNOSIS — J3081 Allergic rhinitis due to animal (cat) (dog) hair and dander: Secondary | ICD-10-CM | POA: Diagnosis not present

## 2018-07-17 DIAGNOSIS — J3089 Other allergic rhinitis: Secondary | ICD-10-CM | POA: Diagnosis not present

## 2018-07-28 ENCOUNTER — Ambulatory Visit: Payer: BLUE CROSS/BLUE SHIELD | Admitting: Certified Nurse Midwife

## 2018-07-29 DIAGNOSIS — J301 Allergic rhinitis due to pollen: Secondary | ICD-10-CM | POA: Diagnosis not present

## 2018-07-29 DIAGNOSIS — Z23 Encounter for immunization: Secondary | ICD-10-CM | POA: Diagnosis not present

## 2018-07-29 DIAGNOSIS — J3081 Allergic rhinitis due to animal (cat) (dog) hair and dander: Secondary | ICD-10-CM | POA: Diagnosis not present

## 2018-07-29 DIAGNOSIS — J3089 Other allergic rhinitis: Secondary | ICD-10-CM | POA: Diagnosis not present

## 2018-08-07 DIAGNOSIS — J3081 Allergic rhinitis due to animal (cat) (dog) hair and dander: Secondary | ICD-10-CM | POA: Diagnosis not present

## 2018-08-07 DIAGNOSIS — J301 Allergic rhinitis due to pollen: Secondary | ICD-10-CM | POA: Diagnosis not present

## 2018-08-07 DIAGNOSIS — J3089 Other allergic rhinitis: Secondary | ICD-10-CM | POA: Diagnosis not present

## 2018-08-14 ENCOUNTER — Ambulatory Visit: Payer: BLUE CROSS/BLUE SHIELD | Admitting: Certified Nurse Midwife

## 2018-08-14 DIAGNOSIS — J301 Allergic rhinitis due to pollen: Secondary | ICD-10-CM | POA: Diagnosis not present

## 2018-08-17 DIAGNOSIS — J3089 Other allergic rhinitis: Secondary | ICD-10-CM | POA: Diagnosis not present

## 2018-08-17 DIAGNOSIS — J3081 Allergic rhinitis due to animal (cat) (dog) hair and dander: Secondary | ICD-10-CM | POA: Diagnosis not present

## 2018-08-19 DIAGNOSIS — J3081 Allergic rhinitis due to animal (cat) (dog) hair and dander: Secondary | ICD-10-CM | POA: Diagnosis not present

## 2018-08-19 DIAGNOSIS — J301 Allergic rhinitis due to pollen: Secondary | ICD-10-CM | POA: Diagnosis not present

## 2018-08-19 DIAGNOSIS — J3089 Other allergic rhinitis: Secondary | ICD-10-CM | POA: Diagnosis not present

## 2018-08-27 DIAGNOSIS — K625 Hemorrhage of anus and rectum: Secondary | ICD-10-CM | POA: Diagnosis not present

## 2018-08-27 DIAGNOSIS — R61 Generalized hyperhidrosis: Secondary | ICD-10-CM | POA: Insufficient documentation

## 2018-08-27 DIAGNOSIS — F419 Anxiety disorder, unspecified: Secondary | ICD-10-CM | POA: Diagnosis not present

## 2018-08-27 DIAGNOSIS — K644 Residual hemorrhoidal skin tags: Secondary | ICD-10-CM | POA: Insufficient documentation

## 2018-08-27 DIAGNOSIS — F988 Other specified behavioral and emotional disorders with onset usually occurring in childhood and adolescence: Secondary | ICD-10-CM | POA: Diagnosis not present

## 2018-08-27 DIAGNOSIS — F909 Attention-deficit hyperactivity disorder, unspecified type: Secondary | ICD-10-CM | POA: Insufficient documentation

## 2018-08-27 DIAGNOSIS — N930 Postcoital and contact bleeding: Secondary | ICD-10-CM | POA: Insufficient documentation

## 2018-08-27 DIAGNOSIS — J309 Allergic rhinitis, unspecified: Secondary | ICD-10-CM | POA: Insufficient documentation

## 2018-09-01 DIAGNOSIS — M791 Myalgia, unspecified site: Secondary | ICD-10-CM | POA: Diagnosis not present

## 2018-09-01 DIAGNOSIS — J0140 Acute pansinusitis, unspecified: Secondary | ICD-10-CM | POA: Diagnosis not present

## 2018-09-09 DIAGNOSIS — J3081 Allergic rhinitis due to animal (cat) (dog) hair and dander: Secondary | ICD-10-CM | POA: Diagnosis not present

## 2018-09-09 DIAGNOSIS — J301 Allergic rhinitis due to pollen: Secondary | ICD-10-CM | POA: Diagnosis not present

## 2018-09-09 DIAGNOSIS — J3089 Other allergic rhinitis: Secondary | ICD-10-CM | POA: Diagnosis not present

## 2018-09-10 DIAGNOSIS — J209 Acute bronchitis, unspecified: Secondary | ICD-10-CM | POA: Diagnosis not present

## 2018-09-10 DIAGNOSIS — K625 Hemorrhage of anus and rectum: Secondary | ICD-10-CM | POA: Diagnosis not present

## 2018-09-10 DIAGNOSIS — K644 Residual hemorrhoidal skin tags: Secondary | ICD-10-CM | POA: Diagnosis not present

## 2018-09-24 DIAGNOSIS — J301 Allergic rhinitis due to pollen: Secondary | ICD-10-CM | POA: Diagnosis not present

## 2018-09-24 DIAGNOSIS — J3081 Allergic rhinitis due to animal (cat) (dog) hair and dander: Secondary | ICD-10-CM | POA: Diagnosis not present

## 2018-09-24 DIAGNOSIS — J0191 Acute recurrent sinusitis, unspecified: Secondary | ICD-10-CM | POA: Diagnosis not present

## 2018-09-24 DIAGNOSIS — J3089 Other allergic rhinitis: Secondary | ICD-10-CM | POA: Diagnosis not present

## 2018-10-21 DIAGNOSIS — Z0000 Encounter for general adult medical examination without abnormal findings: Secondary | ICD-10-CM | POA: Diagnosis not present

## 2018-10-22 DIAGNOSIS — J301 Allergic rhinitis due to pollen: Secondary | ICD-10-CM | POA: Diagnosis not present

## 2018-10-22 DIAGNOSIS — J3089 Other allergic rhinitis: Secondary | ICD-10-CM | POA: Diagnosis not present

## 2018-10-22 DIAGNOSIS — J3081 Allergic rhinitis due to animal (cat) (dog) hair and dander: Secondary | ICD-10-CM | POA: Diagnosis not present

## 2018-10-26 DIAGNOSIS — J3081 Allergic rhinitis due to animal (cat) (dog) hair and dander: Secondary | ICD-10-CM | POA: Diagnosis not present

## 2018-10-26 DIAGNOSIS — J3089 Other allergic rhinitis: Secondary | ICD-10-CM | POA: Diagnosis not present

## 2018-10-26 DIAGNOSIS — J301 Allergic rhinitis due to pollen: Secondary | ICD-10-CM | POA: Diagnosis not present

## 2018-10-28 DIAGNOSIS — F988 Other specified behavioral and emotional disorders with onset usually occurring in childhood and adolescence: Secondary | ICD-10-CM | POA: Diagnosis not present

## 2018-10-28 DIAGNOSIS — Z0000 Encounter for general adult medical examination without abnormal findings: Secondary | ICD-10-CM | POA: Diagnosis not present

## 2018-10-29 DIAGNOSIS — J3081 Allergic rhinitis due to animal (cat) (dog) hair and dander: Secondary | ICD-10-CM | POA: Diagnosis not present

## 2018-10-29 DIAGNOSIS — J301 Allergic rhinitis due to pollen: Secondary | ICD-10-CM | POA: Diagnosis not present

## 2018-10-29 DIAGNOSIS — J3089 Other allergic rhinitis: Secondary | ICD-10-CM | POA: Diagnosis not present

## 2018-11-03 DIAGNOSIS — J301 Allergic rhinitis due to pollen: Secondary | ICD-10-CM | POA: Diagnosis not present

## 2018-11-03 DIAGNOSIS — J3089 Other allergic rhinitis: Secondary | ICD-10-CM | POA: Diagnosis not present

## 2018-11-03 DIAGNOSIS — J3081 Allergic rhinitis due to animal (cat) (dog) hair and dander: Secondary | ICD-10-CM | POA: Diagnosis not present

## 2018-11-05 DIAGNOSIS — J301 Allergic rhinitis due to pollen: Secondary | ICD-10-CM | POA: Diagnosis not present

## 2018-11-05 DIAGNOSIS — J3081 Allergic rhinitis due to animal (cat) (dog) hair and dander: Secondary | ICD-10-CM | POA: Diagnosis not present

## 2018-11-05 DIAGNOSIS — J3089 Other allergic rhinitis: Secondary | ICD-10-CM | POA: Diagnosis not present

## 2018-11-10 DIAGNOSIS — B078 Other viral warts: Secondary | ICD-10-CM | POA: Diagnosis not present

## 2018-11-24 DIAGNOSIS — F419 Anxiety disorder, unspecified: Secondary | ICD-10-CM | POA: Diagnosis not present

## 2018-11-24 DIAGNOSIS — F988 Other specified behavioral and emotional disorders with onset usually occurring in childhood and adolescence: Secondary | ICD-10-CM | POA: Diagnosis not present

## 2018-12-01 DIAGNOSIS — J301 Allergic rhinitis due to pollen: Secondary | ICD-10-CM | POA: Diagnosis not present

## 2018-12-01 DIAGNOSIS — J3081 Allergic rhinitis due to animal (cat) (dog) hair and dander: Secondary | ICD-10-CM | POA: Diagnosis not present

## 2018-12-01 DIAGNOSIS — J3089 Other allergic rhinitis: Secondary | ICD-10-CM | POA: Diagnosis not present

## 2018-12-16 DIAGNOSIS — F419 Anxiety disorder, unspecified: Secondary | ICD-10-CM | POA: Diagnosis not present

## 2018-12-16 DIAGNOSIS — F900 Attention-deficit hyperactivity disorder, predominantly inattentive type: Secondary | ICD-10-CM | POA: Diagnosis not present

## 2018-12-16 DIAGNOSIS — R41840 Attention and concentration deficit: Secondary | ICD-10-CM | POA: Diagnosis not present

## 2018-12-28 DIAGNOSIS — J301 Allergic rhinitis due to pollen: Secondary | ICD-10-CM | POA: Diagnosis not present

## 2018-12-28 DIAGNOSIS — J3081 Allergic rhinitis due to animal (cat) (dog) hair and dander: Secondary | ICD-10-CM | POA: Diagnosis not present

## 2018-12-28 DIAGNOSIS — J3089 Other allergic rhinitis: Secondary | ICD-10-CM | POA: Diagnosis not present

## 2019-01-03 ENCOUNTER — Other Ambulatory Visit: Payer: Self-pay | Admitting: Obstetrics & Gynecology

## 2019-01-03 DIAGNOSIS — Z3041 Encounter for surveillance of contraceptive pills: Secondary | ICD-10-CM

## 2019-01-04 NOTE — Telephone Encounter (Signed)
Medication refill request: Seasonale Last AEX:  07/25/17 DL Last OV: 4/38/38 SM Next AEX: 08/03/19 Last MMG (if hormonal medication request): n/a Refill authorized: Order pended for #91 w/2 refills if authorized to last patient until AEX

## 2019-01-28 DIAGNOSIS — J3089 Other allergic rhinitis: Secondary | ICD-10-CM | POA: Diagnosis not present

## 2019-01-28 DIAGNOSIS — J3081 Allergic rhinitis due to animal (cat) (dog) hair and dander: Secondary | ICD-10-CM | POA: Diagnosis not present

## 2019-01-28 DIAGNOSIS — J301 Allergic rhinitis due to pollen: Secondary | ICD-10-CM | POA: Diagnosis not present

## 2019-02-24 DIAGNOSIS — F419 Anxiety disorder, unspecified: Secondary | ICD-10-CM | POA: Diagnosis not present

## 2019-02-24 DIAGNOSIS — F909 Attention-deficit hyperactivity disorder, unspecified type: Secondary | ICD-10-CM | POA: Diagnosis not present

## 2019-03-04 DIAGNOSIS — J3089 Other allergic rhinitis: Secondary | ICD-10-CM | POA: Diagnosis not present

## 2019-03-04 DIAGNOSIS — J3081 Allergic rhinitis due to animal (cat) (dog) hair and dander: Secondary | ICD-10-CM | POA: Diagnosis not present

## 2019-03-04 DIAGNOSIS — J301 Allergic rhinitis due to pollen: Secondary | ICD-10-CM | POA: Diagnosis not present

## 2019-04-07 DIAGNOSIS — Z1159 Encounter for screening for other viral diseases: Secondary | ICD-10-CM | POA: Diagnosis not present

## 2019-04-07 DIAGNOSIS — J329 Chronic sinusitis, unspecified: Secondary | ICD-10-CM | POA: Diagnosis not present

## 2019-04-29 DIAGNOSIS — J301 Allergic rhinitis due to pollen: Secondary | ICD-10-CM | POA: Diagnosis not present

## 2019-04-29 DIAGNOSIS — J3089 Other allergic rhinitis: Secondary | ICD-10-CM | POA: Diagnosis not present

## 2019-04-29 DIAGNOSIS — J3081 Allergic rhinitis due to animal (cat) (dog) hair and dander: Secondary | ICD-10-CM | POA: Diagnosis not present

## 2019-05-06 DIAGNOSIS — J301 Allergic rhinitis due to pollen: Secondary | ICD-10-CM | POA: Diagnosis not present

## 2019-05-06 DIAGNOSIS — J3089 Other allergic rhinitis: Secondary | ICD-10-CM | POA: Diagnosis not present

## 2019-05-06 DIAGNOSIS — J3081 Allergic rhinitis due to animal (cat) (dog) hair and dander: Secondary | ICD-10-CM | POA: Diagnosis not present

## 2019-05-11 DIAGNOSIS — J301 Allergic rhinitis due to pollen: Secondary | ICD-10-CM | POA: Diagnosis not present

## 2019-05-11 DIAGNOSIS — J3081 Allergic rhinitis due to animal (cat) (dog) hair and dander: Secondary | ICD-10-CM | POA: Diagnosis not present

## 2019-05-11 DIAGNOSIS — J3089 Other allergic rhinitis: Secondary | ICD-10-CM | POA: Diagnosis not present

## 2019-05-26 DIAGNOSIS — F909 Attention-deficit hyperactivity disorder, unspecified type: Secondary | ICD-10-CM | POA: Diagnosis not present

## 2019-05-26 DIAGNOSIS — Z0289 Encounter for other administrative examinations: Secondary | ICD-10-CM | POA: Diagnosis not present

## 2019-06-08 DIAGNOSIS — J301 Allergic rhinitis due to pollen: Secondary | ICD-10-CM | POA: Diagnosis not present

## 2019-06-08 DIAGNOSIS — J3081 Allergic rhinitis due to animal (cat) (dog) hair and dander: Secondary | ICD-10-CM | POA: Diagnosis not present

## 2019-06-08 DIAGNOSIS — J3089 Other allergic rhinitis: Secondary | ICD-10-CM | POA: Diagnosis not present

## 2019-07-02 DIAGNOSIS — L239 Allergic contact dermatitis, unspecified cause: Secondary | ICD-10-CM | POA: Diagnosis not present

## 2019-07-06 DIAGNOSIS — J3089 Other allergic rhinitis: Secondary | ICD-10-CM | POA: Diagnosis not present

## 2019-07-06 DIAGNOSIS — J3081 Allergic rhinitis due to animal (cat) (dog) hair and dander: Secondary | ICD-10-CM | POA: Diagnosis not present

## 2019-07-06 DIAGNOSIS — J301 Allergic rhinitis due to pollen: Secondary | ICD-10-CM | POA: Diagnosis not present

## 2019-07-20 DIAGNOSIS — J3089 Other allergic rhinitis: Secondary | ICD-10-CM | POA: Diagnosis not present

## 2019-07-20 DIAGNOSIS — J301 Allergic rhinitis due to pollen: Secondary | ICD-10-CM | POA: Diagnosis not present

## 2019-07-20 DIAGNOSIS — J3081 Allergic rhinitis due to animal (cat) (dog) hair and dander: Secondary | ICD-10-CM | POA: Diagnosis not present

## 2019-08-03 ENCOUNTER — Ambulatory Visit: Payer: BLUE CROSS/BLUE SHIELD | Admitting: Certified Nurse Midwife

## 2019-08-04 DIAGNOSIS — J3089 Other allergic rhinitis: Secondary | ICD-10-CM | POA: Diagnosis not present

## 2019-08-04 DIAGNOSIS — J301 Allergic rhinitis due to pollen: Secondary | ICD-10-CM | POA: Diagnosis not present

## 2019-08-04 DIAGNOSIS — J3081 Allergic rhinitis due to animal (cat) (dog) hair and dander: Secondary | ICD-10-CM | POA: Diagnosis not present

## 2019-08-12 ENCOUNTER — Other Ambulatory Visit: Payer: Self-pay

## 2019-08-16 ENCOUNTER — Other Ambulatory Visit: Payer: Self-pay

## 2019-08-16 ENCOUNTER — Ambulatory Visit: Payer: BC Managed Care – PPO | Admitting: Certified Nurse Midwife

## 2019-08-16 ENCOUNTER — Encounter: Payer: Self-pay | Admitting: Certified Nurse Midwife

## 2019-08-16 VITALS — BP 108/64 | HR 64 | Temp 97.0°F | Resp 16 | Ht 67.25 in | Wt 177.0 lb

## 2019-08-16 DIAGNOSIS — Z3041 Encounter for surveillance of contraceptive pills: Secondary | ICD-10-CM

## 2019-08-16 DIAGNOSIS — Z01419 Encounter for gynecological examination (general) (routine) without abnormal findings: Secondary | ICD-10-CM

## 2019-08-16 MED ORDER — LEVONORGEST-ETH ESTRAD 91-DAY 0.15-0.03 MG PO TABS: 1.0000 | ORAL_TABLET | Freq: Every day | ORAL | 3 refills | Status: DC

## 2019-08-16 NOTE — Progress Notes (Signed)
23 y.o. G0P0000 Single  Caucasian Fe here for annual exam. Periods normal, no issues. No missed pills or periods in last year. No STD screening needed, one partner only in life time. Had Covid 19 tested positive on 04/15/2019, mild symptoms. New PCP for medication management all stable, for anxiety, ADHD. Still having issues with vaginal dryness and hemorrhoids. Uses Estrace cream for vaginal dryness prn.  PCP management of hemorrhoids.. No rectal bleeding, just with hemorrhoids only. No other health issues today.  No LMP recorded. (Menstrual status: Oral contraceptives).          Sexually active: Yes.    The current method of family planning is OCP (estrogen/progesterone) & condoms. Exercising: Yes.    walking Smoker:  no  Review of Systems  Constitutional: Negative.   HENT: Negative.   Eyes: Negative.   Respiratory: Negative.   Cardiovascular: Negative.   Gastrointestinal: Negative.   Genitourinary: Negative.   Musculoskeletal: Negative.   Skin: Negative.   Neurological: Negative.   Endo/Heme/Allergies: Negative.   Psychiatric/Behavioral: Negative.     Health Maintenance: Pap:  04-30-18 neg History of Abnormal Pap: no MMG:  none Self Breast exams: yes Colonoscopy:  none BMD:   none TDaP:  2011 Shingles: no Pneumonia: no Hep C and HIV: not done Labs: if needed   reports that she has never smoked. She has never used smokeless tobacco. She reports current alcohol use of about 1.0 - 2.0 standard drinks of alcohol per week. She reports that she does not use drugs.  Past Medical History:  Diagnosis Date  . ADHD (attention deficit hyperactivity disorder)     Past Surgical History:  Procedure Laterality Date  . WISDOM TOOTH EXTRACTION  2009    Current Outpatient Medications  Medication Sig Dispense Refill  . fluticasone (FLONASE) 50 MCG/ACT nasal spray SPRAY 1 SPRAY INTO EACH NOSTRIL EVERY DAY    . propranolol (INDERAL) 10 MG tablet Take 1 tablet daily as needed    .  busPIRone (BUSPAR) 10 MG tablet TAKE 1 TABLET BY MOUTH UP TO 2 TIMES A DAY AS NEEDED FOR ANXIETY  5  . clindamycin (CLEOCIN T) 1 % external solution as needed.  2  . dexmethylphenidate (FOCALIN) 5 MG tablet Take 5 mg by mouth as needed.    . DRYSOL 20 % external solution as needed.  2  . estradiol (ESTRACE) 0.1 MG/GM vaginal cream 1 gram vaginally twice weekly 42.5 g 5  . Fexofenadine-Pseudoephedrine (ALLEGRA-D PO) Take by mouth as needed.    Marland Kitchen levocetirizine (XYZAL) 5 MG tablet Take 5 mg by mouth every evening.    Marland Kitchen levonorgestrel-ethinyl estradiol (SEASONALE,INTROVALE,JOLESSA) 0.15-0.03 MG tablet TAKE 1 TABLET BY MOUTH EVERY DAY 91 tablet 2  . lisdexamfetamine (VYVANSE) 30 MG capsule Take 1 capsule by mouth daily.    . montelukast (SINGULAIR) 10 MG tablet Take 10 mg by mouth at bedtime.     No current facility-administered medications for this visit.     Family History  Problem Relation Age of Onset  . Diabetes Mother     ROS:  Pertinent items are noted in HPI.  Otherwise, a comprehensive ROS was negative.  Exam:   BP 108/64   Pulse 64   Temp (!) 97 F (36.1 C) (Skin)   Resp 16   Ht 5' 7.25" (1.708 m)   Wt 177 lb (80.3 kg)   BMI 27.52 kg/m  Height: 5' 7.25" (170.8 cm) Ht Readings from Last 3 Encounters:  08/16/19 5' 7.25" (1.708 m)  06/30/18 5\' 7"  (1.702 m)  04/30/18 5\' 7"  (1.702 m)    General appearance: alert, cooperative and appears stated age Head: Normocephalic, without obvious abnormality, atraumatic Neck: no adenopathy, supple, symmetrical, trachea midline and thyroid normal to inspection and palpation Lungs: clear to auscultation bilaterally Breasts: normal appearance, no masses or tenderness, No nipple retraction or dimpling, No nipple discharge or bleeding, No axillary or supraclavicular adenopathy Heart: regular rate and rhythm Abdomen: soft, non-tender; no masses,  no organomegaly Extremities: extremities normal, atraumatic, no cyanosis or edema Skin: Skin  color, texture, turgor normal. No rashes or lesions Lymph nodes: Cervical, supraclavicular, and axillary nodes normal. No abnormal inguinal nodes palpated Neurologic: Grossly normal   Pelvic: External genitalia:  no lesions              Urethra:  normal appearing urethra with no masses, tenderness or lesions              Bartholin's and Skene's: normal                 Vagina: normal appearing vagina with normal color and discharge, no lesions              Cervix: no cervical motion tenderness, no lesions, nulliparous appearance and normal appearance              Pap taken: No. Bimanual Exam:  Uterus:  normal size, contour, position, consistency, mobility, non-tender and retroverted              Adnexa: normal adnexa and no mass, fullness, tenderness               Rectovaginal: Confirms               Anus:  normal sphincter tone, hemorrhoid tag noted only  Chaperone present: yes  A:  Well Woman with normal exam  Contraception OCP desired  History of anxiety, allergies, hemorrhoids with MD management, all stable  History of Covid 19 with no issues  P:   Reviewed health and wellness pertinent to exam  Risks/benefits/warning signs of OCP given and expectations with bleeding.  Rx  Seasonale see order with instructions  Continue follow up with MD as indicated.  Warning signs with hemorrhoids noted  Pap smear: no   counseled on breast self exam, STD prevention, HIV risk factors and prevention, use and side effects of OCP's, adequate intake of calcium and vitamin D, diet and exercise  return annually or prn  An After Visit Summary was printed and given to the patient.

## 2019-08-16 NOTE — Patient Instructions (Signed)
General topics  Next pap or exam is  due in 1 year Take a Women's multivitamin Take 1200 mg. of calcium daily - prefer dietary If any concerns in interim to call back  Breast Self-Awareness Practicing breast self-awareness may pick up problems early, prevent significant medical complications, and possibly save your life. By practicing breast self-awareness, you can become familiar with how your breasts look and feel and if your breasts are changing. This allows you to notice changes early. It can also offer you some reassurance that your breast health is good. One way to learn what is normal for your breasts and whether your breasts are changing is to do a breast self-exam. If you find a lump or something that was not present in the past, it is best to contact your caregiver right away. Other findings that should be evaluated by your caregiver include nipple discharge, especially if it is bloody; skin changes or reddening; areas where the skin seems to be pulled in (retracted); or new lumps and bumps. Breast pain is seldom associated with cancer (malignancy), but should also be evaluated by a caregiver. BREAST SELF-EXAM The best time to examine your breasts is 5 7 days after your menstrual period is over.  ExitCare Patient Information 2013 [place].   Exercise to Stay Healthy Exercise helps you become and stay healthy. EXERCISE IDEAS AND TIPS Choose exercises that:  You enjoy.  Fit into your day. You do not need to exercise really hard to be healthy. You can do exercises at a slow or medium level and stay healthy. You can:  Stretch before and after working out.  Try yoga, Pilates, or tai chi.  Lift weights.  Walk fast, swim, jog, run, climb stairs, bicycle, dance, or rollerskate.  Take aerobic classes. Exercises that burn about 150 calories:  Running 1  miles in 15 minutes.  Playing volleyball for 45 to 60 minutes.  Washing and waxing a car for 45 to 60  minutes.  Playing touch football for 45 minutes.  Walking 1  miles in 35 minutes.  Pushing a stroller 1  miles in 30 minutes.  Playing basketball for 30 minutes.  Raking leaves for 30 minutes.  Bicycling 5 miles in 30 minutes.  Walking 2 miles in 30 minutes.  Dancing for 30 minutes.  Shoveling snow for 15 minutes.  Swimming laps for 20 minutes.  Walking up stairs for 15 minutes.  Bicycling 4 miles in 15 minutes.  Gardening for 30 to 45 minutes.  Jumping rope for 15 minutes.  Washing windows or floors for 45 to 60 minutes. Document Released: 10/26/2010 Document Revised: 12/16/2011 Document Reviewed: 10/26/2010 [facility] Patient Information 2013 [place].   Other topics ( that may be useful information):    Sexually Transmitted Disease Sexually transmitted disease (STD) refers to any infection that is passed from person to person during sexual activity. This may happen by way of saliva, semen, blood, vaginal mucus, or urine. Common STDs include:  Gonorrhea.  Chlamydia.  Syphilis.  HIV/AIDS.  Genital herpes.  Hepatitis B and C.  Trichomonas.  Human papillomavirus (HPV).  Pubic lice. CAUSES  An STD may be spread by bacteria, virus, or parasite. A person can get an STD by:  Sexual intercourse with an infected person.  Sharing sex toys with an infected person.  Sharing needles with an infected person.  Having intimate contact with the genitals, mouth, or rectal areas of an infected person. SYMPTOMS  Some people may  they can still pass the infection to others. Different STDs have different symptoms. Symptoms include: °· Painful or bloody urination. °· Pain in the pelvis, abdomen, vagina, anus, throat, or eyes. °· Skin rash, itching, irritation, growths, or sores (lesions). These usually occur in the genital or anal area. °· Abnormal vaginal discharge. °· Penile discharge in men. °· Soft, flesh-colored skin growths in the  genital or anal area. °· Fever. °· Pain or bleeding during sexual intercourse. °· Swollen glands in the groin area. °· Yellow skin and eyes (jaundice). This is seen with hepatitis. °DIAGNOSIS  °To make a diagnosis, your caregiver may: °· Take a medical history. °· Perform a physical exam. °· Take a specimen (culture) to be examined. °· Examine a sample of discharge under a microscope. °· Perform blood test °TREATMENT  °· Chlamydia, gonorrhea, trichomonas, and syphilis can be cured with antibiotic medicine. °· Genital herpes, hepatitis, and HIV can be treated, but not cured, with prescribed medicines. The medicines will lessen the symptoms. °· Genital warts from HPV can be treated with medicine or by freezing, burning (electrocautery), or surgery. Warts may come back. °· HPV is a virus and cannot be cured with medicine or surgery. However, abnormal areas may be followed very closely by your caregiver and may be removed from the cervix, vagina, or vulva through office procedures or surgery. °If your diagnosis is confirmed, your recent sexual partners need treatment. This is true even if they are symptom-free or have a negative culture or evaluation. They should not have sex until their caregiver says it is okay. °HOME CARE INSTRUCTIONS °· All sexual partners should be informed, tested, and treated for all STDs. °· Take your antibiotics as directed. Finish them even if you start to feel better. °· Only take over-the-counter or prescription medicines for pain, discomfort, or fever as directed by your caregiver. °· Rest. °· Eat a balanced diet and drink enough fluids to keep your urine clear or pale yellow. °· Do not have sex until treatment is completed and you have followed up with your caregiver. STDs should be checked after treatment. °· Keep all follow-up appointments, Pap tests, and blood tests as directed by your caregiver. °· Only use latex condoms and water-soluble lubricants during sexual activity. Do not use  petroleum jelly or oils. °· Avoid alcohol and illegal drugs. °· Get vaccinated for HPV and hepatitis. If you have not received these vaccines in the past, talk to your caregiver about whether one or both might be right for you. °· Avoid risky sex practices that can break the skin. °The only way to avoid getting an STD is to avoid all sexual activity. Latex condoms and dental dams (for oral sex) will help lessen the risk of getting an STD, but will not completely eliminate the risk. °SEEK MEDICAL CARE IF:  °· You have a fever. °· You have any new or worsening symptoms. °Document Released: 12/14/2002 Document Revised: 12/16/2011 Document Reviewed: 12/21/2010 °ExitCare® Patient Information ©2013 [employer]. ° ° ° °Domestic Abuse °You are being battered or abused if someone close to you hits, pushes, or physically hurts you in any way. You also are being abused if you are forced into activities. You are being sexually abused if you are forced to have sexual contact of any kind. You are being emotionally abused if you are made to feel worthless or if you are constantly threatened. It is important to remember that help is available. No one has the right to abuse you. °PREVENTION OF FURTHER   abuse you. PREVENTION OF FURTHER ABUSE  Learn the warning signs of danger. This varies with situations but may include: the use of alcohol, threats, isolation from friends and family, or forced sexual contact. Leave if you feel that violence is going to occur.  If you are attacked or beaten, report it to the police so the abuse is documented. You do not have to press charges. The police can protect you while you or the attackers are leaving. Get the officer's name and badge number and a copy of the report.  Find someone you can trust and tell them what is happening to you: your caregiver, a nurse, clergy member, close friend or family member. Feeling ashamed is natural, but remember that you have done nothing wrong. No one deserves abuse. Document Released:  09/20/2000 Document Revised: 12/16/2011 Document Reviewed: 11/29/2010 [facility] Patient Information 2013 [place].    How Much is Too Much Alcohol? Drinking too much alcohol can cause injury, accidents, and health problems. These types of problems can include:   Car crashes.  Falls.  Family fighting (domestic violence).  Drowning.  Fights.  Injuries.  Burns.  Damage to certain organs.  Having a baby with birth defects. ONE DRINK CAN BE TOO MUCH WHEN YOU ARE:  Working.  Pregnant or breastfeeding.  Taking medicines. Ask your doctor.  Driving or planning to drive. If you or someone you know has a drinking problem, get help from a doctor.  Document Released: 07/20/2009 Document Revised: 12/16/2011 Document Reviewed: 07/20/2009 [facility] Patient Information 2013 [place].   Smoking Hazards Smoking cigarettes is extremely bad for your health. Tobacco smoke has over 200 known poisons in it. There are over 60 chemicals in tobacco smoke that cause cancer. Some of the chemicals found in cigarette smoke include:   Cyanide.  Benzene.  Formaldehyde.  Methanol (wood alcohol).  Acetylene (fuel used in welding torches).  Ammonia. Cigarette smoke also contains the poisonous gases nitrogen oxide and carbon monoxide.  Cigarette smokers have an increased risk of many serious medical problems and Smoking causes approximately:  90% of all lung cancer deaths in men.  80% of all lung cancer deaths in women.  90% of deaths from chronic obstructive lung disease. Compared with nonsmokers, smoking increases the risk of:  Coronary heart disease by 2 to 4 times.  Stroke by 2 to 4 times.  Men developing lung cancer by 23 times.  Women developing lung cancer by 13 times.  Dying from chronic obstructive lung diseases by 12 times.  . Smoking is the most preventable cause of death and disease in our society.  WHY IS SMOKING ADDICTIVE?  Nicotine is the chemical  agent in tobacco that is capable of causing addiction or dependence.  When you smoke and inhale, nicotine is absorbed rapidly into the bloodstream through your lungs. Nicotine absorbed through the lungs is capable of creating a powerful addiction. Both inhaled and non-inhaled nicotine may be addictive.  Addiction studies of cigarettes and spit tobacco show that addiction to nicotine occurs mainly during the teen years, when young people begin using tobacco products. WHAT ARE THE BENEFITS OF QUITTING?  There are many health benefits to quitting smoking.   Likelihood of developing cancer and heart disease decreases. Health improvements are seen almost immediately.  Blood pressure, pulse rate, and breathing patterns start returning to normal soon after quitting. QUITTING SMOKING   American Lung Association - 1-800-LUNGUSA  American Cancer Society - 1-800-ACS-2345 Document Released: 10/31/2004 Document Revised: 12/16/2011 Document Reviewed: 07/05/2009  ExitCare Patient Information 2013 [place].   Stress Management Stress is a state of physical or mental tension that often results from changes in your life or normal routine. Some common causes of stress are:  Death of a loved one.  Injuries or severe illnesses.  Getting fired or changing jobs.  Moving into a new home. Other causes may be:  Sexual problems.  Business or financial losses.  Taking on a large debt.  Regular conflict with someone at home or at work.  Constant tiredness from lack of sleep. It is not just bad things that are stressful. It may be stressful to:  Win the lottery.  Get married.  Buy a new car. The amount of stress that can be easily tolerated varies from person to person. Changes generally cause stress, regardless of the types of change. Too much stress can affect your health. It may lead to physical or emotional problems. Too little stress (boredom) may also become stressful. SUGGESTIONS TO  REDUCE STRESS:  Talk things over with your family and friends. It often is helpful to share your concerns and worries. If you feel your problem is serious, you may want to get help from a professional counselor.  Consider your problems one at a time instead of lumping them all together. Trying to take care of everything at once may seem impossible. List all the things you need to do and then start with the most important one. Set a goal to accomplish 2 or 3 things each day. If you expect to do too many in a single day you will naturally fail, causing you to feel even more stressed.  Do not use alcohol or drugs to relieve stress. Although you may feel better for a short time, they do not remove the problems that caused the stress. They can also be habit forming.  Exercise regularly - at least 3 times per week. Physical exercise can help to relieve that "uptight" feeling and will relax you.  The shortest distance between despair and hope is often a good night's sleep.  Go to bed and get up on time allowing yourself time for appointments without being rushed.  Take a short "time-out" period from any stressful situation that occurs during the day. Close your eyes and take some deep breaths. Starting with the muscles in your face, tense them, hold it for a few seconds, then relax. Repeat this with the muscles in your neck, shoulders, hand, stomach, back and legs.  Take good care of yourself. Eat a balanced diet and get plenty of rest.  Schedule time for having fun. Take a break from your daily routine to relax. HOME CARE INSTRUCTIONS   Call if you feel overwhelmed by your problems and feel you can no longer manage them on your own.  Return immediately if you feel like hurting yourself or someone else. Document Released: 03/19/2001 Document Revised: 12/16/2011 Document Reviewed: 11/09/2007 [facility] Patient Information 2013 [place].

## 2019-09-06 DIAGNOSIS — J3089 Other allergic rhinitis: Secondary | ICD-10-CM | POA: Diagnosis not present

## 2019-09-06 DIAGNOSIS — J3081 Allergic rhinitis due to animal (cat) (dog) hair and dander: Secondary | ICD-10-CM | POA: Diagnosis not present

## 2019-09-06 DIAGNOSIS — J301 Allergic rhinitis due to pollen: Secondary | ICD-10-CM | POA: Diagnosis not present

## 2019-09-07 DIAGNOSIS — Z5181 Encounter for therapeutic drug level monitoring: Secondary | ICD-10-CM | POA: Diagnosis not present

## 2019-09-07 DIAGNOSIS — F909 Attention-deficit hyperactivity disorder, unspecified type: Secondary | ICD-10-CM | POA: Diagnosis not present

## 2019-09-07 DIAGNOSIS — L709 Acne, unspecified: Secondary | ICD-10-CM | POA: Diagnosis not present

## 2019-09-07 DIAGNOSIS — F419 Anxiety disorder, unspecified: Secondary | ICD-10-CM | POA: Diagnosis not present

## 2019-09-08 DIAGNOSIS — J3081 Allergic rhinitis due to animal (cat) (dog) hair and dander: Secondary | ICD-10-CM | POA: Diagnosis not present

## 2019-09-08 DIAGNOSIS — J301 Allergic rhinitis due to pollen: Secondary | ICD-10-CM | POA: Diagnosis not present

## 2019-09-08 DIAGNOSIS — J3089 Other allergic rhinitis: Secondary | ICD-10-CM | POA: Diagnosis not present

## 2019-09-10 DIAGNOSIS — J3089 Other allergic rhinitis: Secondary | ICD-10-CM | POA: Diagnosis not present

## 2019-09-10 DIAGNOSIS — J301 Allergic rhinitis due to pollen: Secondary | ICD-10-CM | POA: Diagnosis not present

## 2019-09-10 DIAGNOSIS — J3081 Allergic rhinitis due to animal (cat) (dog) hair and dander: Secondary | ICD-10-CM | POA: Diagnosis not present

## 2019-09-13 DIAGNOSIS — J3081 Allergic rhinitis due to animal (cat) (dog) hair and dander: Secondary | ICD-10-CM | POA: Diagnosis not present

## 2019-09-13 DIAGNOSIS — J3089 Other allergic rhinitis: Secondary | ICD-10-CM | POA: Diagnosis not present

## 2019-09-13 DIAGNOSIS — J301 Allergic rhinitis due to pollen: Secondary | ICD-10-CM | POA: Diagnosis not present

## 2019-09-15 DIAGNOSIS — J3081 Allergic rhinitis due to animal (cat) (dog) hair and dander: Secondary | ICD-10-CM | POA: Diagnosis not present

## 2019-09-15 DIAGNOSIS — J301 Allergic rhinitis due to pollen: Secondary | ICD-10-CM | POA: Diagnosis not present

## 2019-09-15 DIAGNOSIS — J3089 Other allergic rhinitis: Secondary | ICD-10-CM | POA: Diagnosis not present

## 2019-09-17 DIAGNOSIS — J3081 Allergic rhinitis due to animal (cat) (dog) hair and dander: Secondary | ICD-10-CM | POA: Diagnosis not present

## 2019-09-17 DIAGNOSIS — J301 Allergic rhinitis due to pollen: Secondary | ICD-10-CM | POA: Diagnosis not present

## 2019-09-17 DIAGNOSIS — J3089 Other allergic rhinitis: Secondary | ICD-10-CM | POA: Diagnosis not present

## 2019-09-20 DIAGNOSIS — J3081 Allergic rhinitis due to animal (cat) (dog) hair and dander: Secondary | ICD-10-CM | POA: Diagnosis not present

## 2019-09-20 DIAGNOSIS — J3089 Other allergic rhinitis: Secondary | ICD-10-CM | POA: Diagnosis not present

## 2019-09-20 DIAGNOSIS — J301 Allergic rhinitis due to pollen: Secondary | ICD-10-CM | POA: Diagnosis not present

## 2019-09-22 DIAGNOSIS — J3081 Allergic rhinitis due to animal (cat) (dog) hair and dander: Secondary | ICD-10-CM | POA: Diagnosis not present

## 2019-09-22 DIAGNOSIS — J3089 Other allergic rhinitis: Secondary | ICD-10-CM | POA: Diagnosis not present

## 2019-09-22 DIAGNOSIS — J301 Allergic rhinitis due to pollen: Secondary | ICD-10-CM | POA: Diagnosis not present

## 2019-12-29 ENCOUNTER — Encounter: Payer: Self-pay | Admitting: Certified Nurse Midwife

## 2020-08-16 ENCOUNTER — Ambulatory Visit: Payer: BC Managed Care – PPO | Admitting: Certified Nurse Midwife

## 2020-08-21 ENCOUNTER — Ambulatory Visit: Payer: BC Managed Care – PPO | Admitting: Nurse Practitioner

## 2020-08-21 NOTE — Progress Notes (Signed)
24 y.o. G0P0000 Single White or Caucasian female here for annual exam.     Denies problems today Hx post coital bleeding, has cervical ectropion, better now Has one lifetime partner, denies need for std screening Lives in [place] with boyfriend and dog. 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  Patient's last menstrual period was 07/08/2020 (approximate).   seasonale-extended regimen Sexually active: Yes.    The current method of family planning is OCP (estrogen/progesterone).    Exercising: Yes.    walking and pilates Smoker:  no  Health Maintenance: Pap:  04-30-18 neg History of abnormal Pap:  2019, normal MMG:  none Colonoscopy:  none BMD:   none TDaP:  2021 Gardasil:   Had 1? Covid-19: moderna Pneumonia vaccine(s):  NA Shingrix:   no Hep C testing: Unsure  Screening Labs: PCP    reports that she has never smoked. She has never used smokeless tobacco. She reports current alcohol use of about 1.0 - 2.0 standard drink of alcohol per week. She reports that she does not use drugs.  Past Medical History:  Diagnosis Date   ADHD (attention deficit hyperactivity disorder)     Past Surgical History:  Procedure Laterality Date   WISDOM TOOTH EXTRACTION  2009    Current Outpatient Medications  Medication Sig Dispense Refill   clindamycin (CLEOCIN T) 1 % external solution as needed.  2   DRYSOL 20 % external solution as needed.  2   estradiol (ESTRACE) 0.1 MG/GM vaginal cream 1 gram vaginally twice weekly 42.5 g 5   Fexofenadine-Pseudoephedrine (ALLEGRA-D PO) Take by mouth as needed.     fluticasone (FLONASE) 50 MCG/ACT nasal spray SPRAY 1 SPRAY INTO EACH NOSTRIL EVERY DAY     levocetirizine (XYZAL) 5 MG tablet Take 5 mg by mouth every evening.     levonorgestrel-ethinyl estradiol (SEASONALE) 0.15-0.03 MG tablet Take 1 tablet by mouth daily. 91 tablet 3   lisdexamfetamine (VYVANSE) 30 MG capsule Take 1 capsule by mouth daily.      montelukast (SINGULAIR) 10 MG tablet Take 10 mg by mouth at bedtime.     propranolol (INDERAL) 10 MG tablet Take 1 tablet daily as needed     No current facility-administered medications for this visit.    Family History  Problem Relation Age of Onset   Diabetes Mother     Review of Systems  Exam:   BP 128/74    Pulse 68    Resp 12    Ht 5\' 7"  (1.702 m)    Wt 174 lb 12.8 oz (79.3 kg)    LMP 07/08/2020 (Approximate)    SpO2 100%    BMI 27.38 kg/m   Height: 5\' 7"  (170.2 cm)  General appearance: alert, cooperative and appears stated age Head: Normocephalic, without obvious abnormality, atraumatic Neck: no adenopathy, supple, symmetrical, trachea midline and thyroid normal to inspection and palpation Lungs: clear to auscultation bilaterally Breasts: normal appearance, no masses or tenderness Heart: regular rate and rhythm Abdomen: soft, non-tender; bowel sounds normal; no masses,  no organomegaly Extremities: extremities normal, atraumatic, no cyanosis or edema Skin: Skin color, texture, turgor normal. No rashes or lesions Lymph nodes: Cervical, supraclavicular, and axillary nodes normal. No abnormal inguinal nodes palpated Neurologic: Grossly normal   Pelvic: External genitalia:  no lesions              Urethra:  normal appearing urethra with no masses, tenderness or lesions  Bartholins and Skenes: normal                 Vagina: normal appearing vagina with normal color and discharge, no lesions              Cervix: no cervical motion tenderness, no lesions and ectropic              Pap taken: No. Bimanual Exam:  Uterus:  normal size, contour, position, consistency, mobility, non-tender              Adnexa: no mass, fullness, tenderness               Rectovaginal: Confirms               Anus:  normal sphincter tone, no lesions  A:  Well Woman with normal exam  Seasonale for Contraception  P:     pap smear due 2022  Rx: Seasonale sent to Pharmacy  return  annually or prn

## 2020-08-24 ENCOUNTER — Ambulatory Visit: Payer: BC Managed Care – PPO | Admitting: Nurse Practitioner

## 2020-08-24 ENCOUNTER — Encounter: Payer: Self-pay | Admitting: Nurse Practitioner

## 2020-08-24 ENCOUNTER — Other Ambulatory Visit: Payer: Self-pay

## 2020-08-24 VITALS — BP 128/74 | HR 68 | Resp 12 | Ht 67.0 in | Wt 174.8 lb

## 2020-08-24 DIAGNOSIS — Z3041 Encounter for surveillance of contraceptive pills: Secondary | ICD-10-CM

## 2020-08-24 DIAGNOSIS — Z01419 Encounter for gynecological examination (general) (routine) without abnormal findings: Secondary | ICD-10-CM | POA: Diagnosis not present

## 2020-08-24 MED ORDER — LEVONORGEST-ETH ESTRAD 91-DAY 0.15-0.03 MG PO TABS: 1.0000 | ORAL_TABLET | Freq: Every day | ORAL | 3 refills | Status: DC

## 2020-08-24 NOTE — Patient Instructions (Signed)
Nice to meet you. Keep up the good work you are doing for your body and long term health!

## 2021-08-28 ENCOUNTER — Other Ambulatory Visit: Payer: Self-pay

## 2021-08-28 DIAGNOSIS — Z3041 Encounter for surveillance of contraceptive pills: Secondary | ICD-10-CM

## 2021-08-28 MED ORDER — LEVONORGEST-ETH ESTRAD 91-DAY 0.15-0.03 MG PO TABS: 1.0000 | ORAL_TABLET | Freq: Every day | ORAL | 0 refills | Status: DC

## 2021-08-28 NOTE — Telephone Encounter (Signed)
AEX 08/24/20 with Tresa Endo Dixon,NP  Scheduled 09/05/21  with Dr. Oscar La

## 2021-09-03 NOTE — Progress Notes (Signed)
25 y.o. G0P0000 Single White or Caucasian Not Hispanic or Latino female here for annual exam.   Period Cycle (Days): 90 Period Duration (Days): 4-5 Period Pattern: Regular Menstrual Flow: Heavy Menstrual Control: Tampon Menstrual Control Change Freq (Hours): 2-3 Dysmenorrhea: (!) Mild Dysmenorrhea Symptoms: Cramping Random spotting. Sexually active, getting married in 2/23.   Patient's last menstrual period was 07/09/2021.          Sexually active: Yes.    The current method of family planning is OCP (estrogen/progesterone).    Exercising: Yes.     Walking  Smoker:  no  Health Maintenance: Pap:  04-30-18 neg History of abnormal Pap:  no MMG:  none  BMD:   none  Colonoscopy: none  TDaP:  2021  Gardasil: complete    reports that she has never smoked. She has never used smokeless tobacco. She reports current alcohol use of about 1.0 - 2.0 standard drink per week. She reports that she does not use drugs. Works works in Engineer, water. Lives in [place].   Past Medical History:  Diagnosis Date   ADHD (attention deficit hyperactivity disorder)     Past Surgical History:  Procedure Laterality Date   WISDOM TOOTH EXTRACTION  2009    Current Outpatient Medications  Medication Sig Dispense Refill   clindamycin (CLEOCIN T) 1 % external solution as needed.  2   DRYSOL 20 % external solution as needed.  2   Fexofenadine-Pseudoephedrine (ALLEGRA-D PO) Take by mouth as needed.     fluticasone (FLONASE) 50 MCG/ACT nasal spray SPRAY 1 SPRAY INTO EACH NOSTRIL EVERY DAY     levocetirizine (XYZAL) 5 MG tablet Take 5 mg by mouth every evening.     levonorgestrel-ethinyl estradiol (SEASONALE) 0.15-0.03 MG tablet Take 1 tablet by mouth daily. 91 tablet 0   lisdexamfetamine (VYVANSE) 30 MG capsule Take 1 capsule by mouth daily.     montelukast (SINGULAIR) 10 MG tablet Take 10 mg by mouth at bedtime.     propranolol (INDERAL) 10 MG tablet Take 1 tablet daily as needed     No current  facility-administered medications for this visit.    Family History  Problem Relation Age of Onset   Diabetes Mother     Review of Systems  All other systems reviewed and are negative.  Exam:   BP 122/74   Pulse 87   Ht 5\' 7"  (1.702 m)   Wt 166 lb (75.3 kg)   LMP 07/09/2021   SpO2 99%   BMI 26.00 kg/m   Weight change: @WEIGHTCHANGE @ Height:   Height: 5\' 7"  (170.2 cm)  Ht Readings from Last 3 Encounters:  09/05/21 5\' 7"  (1.702 m)  08/24/20 5\' 7"  (1.702 m)  08/16/19 5' 7.25" (1.708 m)    General appearance: alert, cooperative and appears stated age Head: Normocephalic, without obvious abnormality, atraumatic Neck: no adenopathy, supple, symmetrical, trachea midline and thyroid normal to inspection and palpation Lungs: clear to auscultation bilaterally Cardiovascular: regular rate and rhythm Breasts: normal appearance, no masses or tenderness Abdomen: soft, non-tender; non distended,  no masses,  no organomegaly Extremities: extremities normal, atraumatic, no cyanosis or edema Skin: Skin color, texture, turgor normal. No rashes or lesions Lymph nodes: Cervical, supraclavicular, and axillary nodes normal. No abnormal inguinal nodes palpated Neurologic: Grossly normal   Pelvic: External genitalia:  no lesions              Urethra:  normal appearing urethra with no masses, tenderness or lesions  Bartholins and Skenes: normal                 Vagina: normal appearing vagina with normal color and discharge, no lesions              Cervix: no cervical motion tenderness and ectropion, erythematous, very friable               Bimanual Exam:  Uterus:  normal size, contour, position, consistency, mobility, non-tender and retroverted              Adnexa: no mass, fullness, tenderness               Rectovaginal: Confirms               Anus:  normal sphincter tone, no lesions  Carolynn Serve chaperoned for the exam.  1. Well woman exam Discussed breast self  exam Discussed calcium and vit D intake Labs UTD  2. Screening for cervical cancer - Cytology - PAP  3. Encounter for surveillance of contraceptive pills Doing well - levonorgestrel-ethinyl estradiol (SEASONALE) 0.15-0.03 MG tablet; Take 1 tablet by mouth daily.  Dispense: 91 tablet; Refill: 3  4. Cervicitis She states she has always had a friable, red cervix. - Cytology - PAP

## 2021-09-05 ENCOUNTER — Other Ambulatory Visit (HOSPITAL_COMMUNITY)
Admission: RE | Admit: 2021-09-05 | Discharge: 2021-09-05 | Disposition: A | Discharge status: Home / Self Care | Payer: BC Managed Care – PPO | Source: Ambulatory Visit | Attending: Obstetrics and Gynecology | Admitting: Obstetrics and Gynecology

## 2021-09-05 ENCOUNTER — Ambulatory Visit (INDEPENDENT_AMBULATORY_CARE_PROVIDER_SITE_OTHER): Payer: BC Managed Care – PPO | Admitting: Obstetrics and Gynecology

## 2021-09-05 ENCOUNTER — Other Ambulatory Visit: Payer: Self-pay

## 2021-09-05 ENCOUNTER — Encounter: Payer: Self-pay | Admitting: Obstetrics and Gynecology

## 2021-09-05 VITALS — BP 122/74 | HR 87 | Ht 67.0 in | Wt 166.0 lb

## 2021-09-05 DIAGNOSIS — Z124 Encounter for screening for malignant neoplasm of cervix: Secondary | ICD-10-CM

## 2021-09-05 DIAGNOSIS — N72 Inflammatory disease of cervix uteri: Secondary | ICD-10-CM

## 2021-09-05 DIAGNOSIS — Z3041 Encounter for surveillance of contraceptive pills: Secondary | ICD-10-CM

## 2021-09-05 DIAGNOSIS — Z01419 Encounter for gynecological examination (general) (routine) without abnormal findings: Secondary | ICD-10-CM | POA: Diagnosis not present

## 2021-09-05 MED ORDER — LEVONORGEST-ETH ESTRAD 91-DAY 0.15-0.03 MG PO TABS: 1.0000 | ORAL_TABLET | Freq: Every day | ORAL | 3 refills | Status: AC

## 2021-09-05 NOTE — Patient Instructions (Signed)

## 2021-09-10 LAB — CYTOLOGY - PAP
Chlamydia: NEGATIVE
Comment: NEGATIVE
Comment: NEGATIVE
Comment: NORMAL
Diagnosis: NEGATIVE
Neisseria Gonorrhea: NEGATIVE
Trichomonas: NEGATIVE
